# Patient Record
Sex: Female | Born: 1985 | Race: Black or African American | Hispanic: Yes | Marital: Single | Smoking: Former smoker
Health system: Southern US, Community
[De-identification: ages and names within clinical notes are randomized; demographics above are authoritative.]

## PROBLEM LIST (undated history)

## (undated) ENCOUNTER — Inpatient Hospital Stay (HOSPITAL_COMMUNITY): Payer: Self-pay

## (undated) DIAGNOSIS — O24419 Gestational diabetes mellitus in pregnancy, unspecified control: Secondary | ICD-10-CM

## (undated) DIAGNOSIS — F419 Anxiety disorder, unspecified: Secondary | ICD-10-CM

## (undated) DIAGNOSIS — F909 Attention-deficit hyperactivity disorder, unspecified type: Secondary | ICD-10-CM

## (undated) DIAGNOSIS — F329 Major depressive disorder, single episode, unspecified: Secondary | ICD-10-CM

## (undated) DIAGNOSIS — F32A Depression, unspecified: Secondary | ICD-10-CM

## (undated) DIAGNOSIS — F4310 Post-traumatic stress disorder, unspecified: Secondary | ICD-10-CM

## (undated) HISTORY — PX: TONSILLECTOMY: SUR1361

## (undated) HISTORY — PX: OTHER SURGICAL HISTORY: SHX169

## (undated) HISTORY — PX: TUBAL LIGATION: SHX77

---

## 2007-07-01 HISTORY — PX: OTHER SURGICAL HISTORY: SHX169

## 2010-08-19 LAB — CBC
Hemoglobin: 11.1 g/dL — AB (ref 12.0–16.0)
Platelets: 229 10*3/uL (ref 150–399)

## 2010-08-19 LAB — RPR
RPR: NONREACTIVE
RPR: NONREACTIVE

## 2010-08-19 LAB — ABO/RH: RH Type: POSITIVE

## 2010-08-19 LAB — RUBELLA ANTIBODY, IGM: Rubella: IMMUNE

## 2011-01-17 ENCOUNTER — Other Ambulatory Visit: Payer: Self-pay | Admitting: Obstetrics and Gynecology

## 2011-01-20 ENCOUNTER — Inpatient Hospital Stay (HOSPITAL_COMMUNITY)
Admission: AD | Admit: 2011-01-20 | Discharge: 2011-01-20 | Disposition: A | Discharge status: Home / Self Care | Payer: Self-pay | Source: Ambulatory Visit | Attending: Obstetrics and Gynecology | Admitting: Obstetrics and Gynecology

## 2011-01-20 ENCOUNTER — Encounter (HOSPITAL_COMMUNITY): Payer: Self-pay | Admitting: *Deleted

## 2011-01-20 DIAGNOSIS — F909 Attention-deficit hyperactivity disorder, unspecified type: Secondary | ICD-10-CM | POA: Insufficient documentation

## 2011-01-20 DIAGNOSIS — O4702 False labor before 37 completed weeks of gestation, second trimester: Secondary | ICD-10-CM

## 2011-01-20 DIAGNOSIS — O99810 Abnormal glucose complicating pregnancy: Secondary | ICD-10-CM | POA: Insufficient documentation

## 2011-01-20 DIAGNOSIS — O4700 False labor before 37 completed weeks of gestation, unspecified trimester: Secondary | ICD-10-CM | POA: Insufficient documentation

## 2011-01-20 HISTORY — DX: Anxiety disorder, unspecified: F41.9

## 2011-01-20 HISTORY — DX: Attention-deficit hyperactivity disorder, unspecified type: F90.9

## 2011-01-20 HISTORY — DX: Major depressive disorder, single episode, unspecified: F32.9

## 2011-01-20 HISTORY — DX: Post-traumatic stress disorder, unspecified: F43.10

## 2011-01-20 HISTORY — DX: Depression, unspecified: F32.A

## 2011-01-20 HISTORY — DX: Gestational diabetes mellitus in pregnancy, unspecified control: O24.419

## 2011-01-20 MED ORDER — NIFEDIPINE 10 MG PO CAPS
10.0000 mg | ORAL_CAPSULE | Freq: Once | ORAL | Status: AC
  Administered 2011-01-20: 10 mg via ORAL
  Filled 2011-01-20: qty 1

## 2011-01-20 NOTE — ED Provider Notes (Signed)
History     Chief Complaint  Patient presents with  . Contractions   HPI Pt sent from office today due to uterine contractions noted on toco while having NST at the office.  Pt reports feeling some pelvic pressure and some contractions which she describes as mild.  She denies ROM or bldg.  She reports her fetus is moving normally.  Her SVE at the office was noted to be closed/long and -1 per Dr. Su Hilt.    OB History    Grav Para Term Preterm Abortions TAB SAB Ect Mult Living   3 2 2       2       Past Medical History  Diagnosis Date  . Gestational diabetes   . Asthma   . Anxiety   . Depression   . ADHD (attention deficit hyperactivity disorder)   . ADHD (attention deficit hyperactivity disorder)   . PTSD (post-traumatic stress disorder)     Past Surgical History  Procedure Date  . Cesarean section   . Tonsillectomy   . Addnoidectomy     No family history on file.  History  Substance Use Topics  . Smoking status: Current Everyday Smoker -- 4.0 packs/day  . Smokeless tobacco: Not on file  . Alcohol Use: No    Allergies: No Known Allergies  No prescriptions prior to admission    Review of Systems  Constitutional: Negative.   HENT: Negative.   Eyes: Negative.   Respiratory: Negative.   Gastrointestinal: Negative.   Genitourinary: Negative.   Musculoskeletal: Negative.   Skin: Negative.   Neurological: Negative.   Endo/Heme/Allergies: Negative.   Psychiatric/Behavioral: Negative.    Physical Exam   Blood pressure 119/74, pulse 74, temperature 98.1 F (36.7 C), temperature source Oral, resp. rate 20, height 5\' 7"  (1.702 m), weight 104.327 kg (230 lb), last menstrual period 05/12/2010, SpO2 99.00%. Filed Vitals:   01/20/11 1753 01/20/11 1930 01/20/11 2044  BP: 130/74 111/52 119/74  Pulse: 91 78 74  Temp: 98.1 F (36.7 C)    TempSrc: Oral    Resp: 20 20   Height: 5\' 7"  (1.702 m)    Weight: 104.327 kg (230 lb)    SpO2: 99%     Physical Exam    Constitutional: She is oriented to person, place, and time. She appears well-developed and well-nourished.  HENT:  Head: Normocephalic and atraumatic.  Eyes: Pupils are equal, round, and reactive to light.  Neck: Normal range of motion. Neck supple. No thyromegaly present.  Cardiovascular: Normal rate, regular rhythm, normal heart sounds and intact distal pulses.   Respiratory: Effort normal and breath sounds normal.  GI: Soft. Bowel sounds are normal.  Genitourinary: Vagina normal and uterus normal.  Musculoskeletal: Normal range of motion.  Neurological: She is alert and oriented to person, place, and time. She has normal reflexes.  Skin: Skin is warm and dry.  Psychiatric: She has a normal mood and affect. Her behavior is normal. Judgment and thought content normal.   FHR tracing Cat 1 with accels present, no decels present and moderate variability present.  Contractions approx every on admission with decrease to uterine irritability after dose of Procardia 10mg  x 1. SVE per RN upon admission: FT/Thick SVE at discharge: FT/25%/-3   MAU Course  Procedures Fetal monitoring Tocolytic  Assessment and Plan  IUP at 36.1 weeks. Gestational diabetes. False labor.  Consult obtained with Dr. Estanislado Pandy. Pt discharged to home and will follow up as scheduled on Thursday, 01/23/11.  Kathleen Ewen O. 01/20/2011, 10:49 PM

## 2011-01-20 NOTE — Progress Notes (Signed)
PT WAS AT OFFICE TODAY AND WAS CLOSED STARTED HAVING CONTRACTIONS.

## 2011-01-22 ENCOUNTER — Other Ambulatory Visit: Payer: Self-pay | Admitting: Obstetrics and Gynecology

## 2011-01-22 ENCOUNTER — Encounter: Payer: Medicaid Other | Attending: Specialist | Admitting: Dietician

## 2011-01-22 DIAGNOSIS — Z713 Dietary counseling and surveillance: Secondary | ICD-10-CM | POA: Insufficient documentation

## 2011-01-22 DIAGNOSIS — O99810 Abnormal glucose complicating pregnancy: Secondary | ICD-10-CM | POA: Insufficient documentation

## 2011-01-22 DIAGNOSIS — O24419 Gestational diabetes mellitus in pregnancy, unspecified control: Secondary | ICD-10-CM

## 2011-01-23 NOTE — Progress Notes (Deleted)
Subjective:     Patient ID: Kathleen Collins, female   DOB: 05/08/1986, 25 y.o.   MRN: [id number]  HPI   Review of Systems     Objective:   Physical Exam     Assessment:     ***    Plan:     ***

## 2011-01-23 NOTE — Progress Notes (Signed)
  Patient was seen on 01/22/2011 for Gestational Diabetes self-management class at [facility]. The following learning objectives were met by the patient during this course:   States the definition of Gestational Diabetes  States why dietary management is important in controlling blood glucose  Describes the effects each nutrient has on blood glucose levels  Demonstrates ability to create a balanced meal plan  Demonstrates carbohydrate counting   States when to check blood glucose levels  Demonstrates proper blood glucose monitoring techniques  States the effect of stress and exercise on blood glucose levels  States the importance of limiting caffeine and abstaining from alcohol and smoking  Blood glucose monitor given: [employer] Monitoring System) Lot # [id number] Exp: 05/29/2012 Blood glucose reading: 131 @ 6:50 PM.  Had large snack prior to class.  Patient instructed to monitor glucose levels: Fasting and 2 hours post meal FBS: 60 - <90 1 hour: <140 2 hour: <120  Patient will be seen for follow-up as needed.

## 2011-01-24 ENCOUNTER — Other Ambulatory Visit: Payer: Self-pay | Admitting: Obstetrics and Gynecology

## 2011-02-03 ENCOUNTER — Encounter (HOSPITAL_COMMUNITY): Payer: Self-pay

## 2011-02-03 ENCOUNTER — Encounter (HOSPITAL_COMMUNITY)
Admission: RE | Admit: 2011-02-03 | Discharge: 2011-02-03 | Disposition: A | Discharge status: Home / Self Care | Payer: Medicaid Other | Source: Ambulatory Visit | Attending: Obstetrics and Gynecology | Admitting: Obstetrics and Gynecology

## 2011-02-03 LAB — CBC
MCH: 27.2 pg (ref 26.0–34.0)
MCHC: 34.3 g/dL (ref 30.0–36.0)
Platelets: 210 10*3/uL (ref 150–400)

## 2011-02-03 LAB — BASIC METABOLIC PANEL
BUN: 6 mg/dL (ref 6–23)
Calcium: 10.2 mg/dL (ref 8.4–10.5)
GFR calc non Af Amer: >60 mL/min (ref >60)
Glucose, Bld: 81 mg/dL (ref 70–99)

## 2011-02-03 LAB — RPR: RPR Ser Ql: NONREACTIVE

## 2011-02-03 NOTE — Patient Instructions (Signed)
20 Kathleen Collins  02/03/2011   Your procedure is scheduled on:  02/10/11  Report to [facility] at 0745 AM. main entrance desk  Call this number if you have problems the morning of surgery: [phone]   Remember: bring inhaler to hospital   Do not eat food:After Midnight.  Do not drink clear liquids: After Midnight.  Take these medicines the morning of surgery with A SIP OF WATER: none   Do not wear jewelry, make-up or nail polish.  Do not wear lotions, powders, or perfumes. You may wear deodorant.  Do not shave 48 hours prior to surgery.  Do not bring valuables to the hospital.  Contacts, dentures or bridgework may not be worn into surgery.  Leave suitcase in the car. After surgery it may be brought to your room.  For patients admitted to the hospital, checkout time is 11:00 AM the day of discharge.   Patients discharged the day of surgery will not be allowed to drive home.  Name and phone number of your driver: Kathleen Collins- mother- [phone]  Special Instructions: N/A   Please read over the following fact sheets that you were given:

## 2011-02-04 MED ORDER — CEFAZOLIN SODIUM 1 G IJ SOLR: 2.0000 g | Freq: Three times a day (TID) | INTRAMUSCULAR | Status: AC

## 2011-02-04 MED ORDER — DEXTROSE 5 % IV SOLN: 2.0000 g | Freq: Once | INTRAVENOUS | Status: DC

## 2011-02-10 ENCOUNTER — Encounter (HOSPITAL_COMMUNITY): Payer: Self-pay | Admitting: Registered Nurse

## 2011-02-10 ENCOUNTER — Encounter (HOSPITAL_COMMUNITY): Payer: Self-pay | Admitting: Anesthesiology

## 2011-02-10 ENCOUNTER — Encounter (HOSPITAL_COMMUNITY): Payer: Self-pay | Admitting: *Deleted

## 2011-02-10 ENCOUNTER — Encounter (HOSPITAL_COMMUNITY)
Admission: AD | Disposition: A | Discharge status: Home / Self Care | Payer: Self-pay | Source: Ambulatory Visit | Attending: Obstetrics and Gynecology

## 2011-02-10 ENCOUNTER — Inpatient Hospital Stay (HOSPITAL_COMMUNITY)
Admission: AD | Admit: 2011-02-10 | Discharge: 2011-02-13 | DRG: 766 | Disposition: A | Discharge status: Home / Self Care | Payer: Medicaid Other | Source: Ambulatory Visit | Attending: Obstetrics and Gynecology | Admitting: Obstetrics and Gynecology

## 2011-02-10 ENCOUNTER — Inpatient Hospital Stay (HOSPITAL_COMMUNITY): Payer: Medicaid Other | Admitting: Registered Nurse

## 2011-02-10 DIAGNOSIS — Z01818 Encounter for other preprocedural examination: Secondary | ICD-10-CM

## 2011-02-10 DIAGNOSIS — O99344 Other mental disorders complicating childbirth: Secondary | ICD-10-CM | POA: Diagnosis present

## 2011-02-10 DIAGNOSIS — O24419 Gestational diabetes mellitus in pregnancy, unspecified control: Secondary | ICD-10-CM | POA: Diagnosis present

## 2011-02-10 DIAGNOSIS — F319 Bipolar disorder, unspecified: Secondary | ICD-10-CM | POA: Diagnosis present

## 2011-02-10 DIAGNOSIS — J45909 Unspecified asthma, uncomplicated: Secondary | ICD-10-CM

## 2011-02-10 DIAGNOSIS — O34219 Maternal care for unspecified type scar from previous cesarean delivery: Principal | ICD-10-CM | POA: Diagnosis present

## 2011-02-10 DIAGNOSIS — O99814 Abnormal glucose complicating childbirth: Secondary | ICD-10-CM | POA: Diagnosis present

## 2011-02-10 DIAGNOSIS — Z01812 Encounter for preprocedural laboratory examination: Secondary | ICD-10-CM

## 2011-02-10 LAB — TYPE AND SCREEN
ABO/RH(D): B POS
Antibody Screen: NEGATIVE

## 2011-02-10 SURGERY — Surgical Case
Anesthesia: Spinal | Wound class: Clean Contaminated

## 2011-02-10 MED ORDER — SODIUM CHLORIDE 0.9 % IJ SOLN: 3.0000 mL | INTRAMUSCULAR | Status: DC | PRN

## 2011-02-10 MED ORDER — SCOPOLAMINE 1 MG/3DAYS TD PT72: 1.0000 | MEDICATED_PATCH | Freq: Once | TRANSDERMAL | Status: DC

## 2011-02-10 MED ORDER — DIPHENHYDRAMINE HCL 25 MG PO CAPS
25.0000 mg | ORAL_CAPSULE | ORAL | Status: DC | PRN
  Administered 2011-02-11: 25 mg via ORAL
  Filled 2011-02-10 (×2): qty 1

## 2011-02-10 MED ORDER — DIPHENHYDRAMINE HCL 25 MG PO CAPS
25.0000 mg | ORAL_CAPSULE | Freq: Four times a day (QID) | ORAL | Status: DC | PRN
  Administered 2011-02-10: 25 mg via ORAL

## 2011-02-10 MED ORDER — FENTANYL CITRATE 0.05 MG/ML IJ SOLN
INTRAMUSCULAR | Status: DC | PRN
  Administered 2011-02-10: 12.5 ug via INTRAVENOUS

## 2011-02-10 MED ORDER — EPHEDRINE SULFATE 50 MG/ML IJ SOLN
INTRAMUSCULAR | Status: DC | PRN
  Administered 2011-02-10 (×2): 10 mg via INTRAVENOUS

## 2011-02-10 MED ORDER — PROMETHAZINE HCL 25 MG/ML IJ SOLN: 6.2500 mg | INTRAMUSCULAR | Status: DC | PRN

## 2011-02-10 MED ORDER — METOCLOPRAMIDE HCL 5 MG/ML IJ SOLN: 10.0000 mg | Freq: Three times a day (TID) | INTRAMUSCULAR | Status: DC | PRN

## 2011-02-10 MED ORDER — PHENYLEPHRINE 40 MCG/ML (10ML) SYRINGE FOR IV PUSH (FOR BLOOD PRESSURE SUPPORT)
PREFILLED_SYRINGE | INTRAVENOUS | Status: AC
  Filled 2011-02-10: qty 5

## 2011-02-10 MED ORDER — NALOXONE HCL 0.4 MG/ML IJ SOLN: 0.4000 mg | INTRAMUSCULAR | Status: DC | PRN

## 2011-02-10 MED ORDER — PANTOPRAZOLE SODIUM 40 MG PO TBEC: 40.0000 mg | DELAYED_RELEASE_TABLET | Freq: Once | ORAL | Status: DC | PRN

## 2011-02-10 MED ORDER — ONDANSETRON HCL 4 MG/2ML IJ SOLN
INTRAMUSCULAR | Status: AC
  Filled 2011-02-10: qty 2

## 2011-02-10 MED ORDER — IBUPROFEN 600 MG PO TABS
600.0000 mg | ORAL_TABLET | Freq: Four times a day (QID) | ORAL | Status: DC | PRN
  Filled 2011-02-10 (×7): qty 1

## 2011-02-10 MED ORDER — ALBUTEROL SULFATE HFA 108 (90 BASE) MCG/ACT IN AERS
1.0000 | INHALATION_SPRAY | RESPIRATORY_TRACT | Status: DC | PRN
  Administered 2011-02-10: 1 via RESPIRATORY_TRACT
  Filled 2011-02-10: qty 6.7

## 2011-02-10 MED ORDER — BUPIVACAINE HCL (PF) 0.25 % IJ SOLN
INTRAMUSCULAR | Status: DC | PRN
  Administered 2011-02-10: 20 mL

## 2011-02-10 MED ORDER — FLUOXETINE HCL 20 MG PO CAPS
20.0000 mg | ORAL_CAPSULE | Freq: Every day | ORAL | Status: DC
  Administered 2011-02-10 – 2011-02-13 (×4): 20 mg via ORAL
  Filled 2011-02-10 (×4): qty 1

## 2011-02-10 MED ORDER — METHYLERGONOVINE MALEATE 0.2 MG PO TABS: 0.2000 mg | ORAL_TABLET | ORAL | Status: DC | PRN

## 2011-02-10 MED ORDER — LANOLIN HYDROUS EX OINT: 1.0000 "application " | TOPICAL_OINTMENT | CUTANEOUS | Status: DC | PRN

## 2011-02-10 MED ORDER — TETANUS-DIPHTH-ACELL PERTUSSIS 5-2.5-18.5 LF-MCG/0.5 IM SUSP: 0.5000 mL | Freq: Once | INTRAMUSCULAR | Status: DC

## 2011-02-10 MED ORDER — KETOROLAC TROMETHAMINE 30 MG/ML IJ SOLN: 30.0000 mg | Freq: Four times a day (QID) | INTRAMUSCULAR | Status: AC | PRN

## 2011-02-10 MED ORDER — FAMOTIDINE 20 MG PO TABS: 20.0000 mg | ORAL_TABLET | Freq: Once | ORAL | Status: DC | PRN

## 2011-02-10 MED ORDER — ARIPIPRAZOLE 2 MG PO TABS
2.0000 mg | ORAL_TABLET | Freq: Every day | ORAL | Status: DC
  Administered 2011-02-10 – 2011-02-13 (×4): 2 mg via ORAL
  Filled 2011-02-10 (×4): qty 1

## 2011-02-10 MED ORDER — EPHEDRINE 5 MG/ML INJ
INTRAVENOUS | Status: AC
  Filled 2011-02-10: qty 10

## 2011-02-10 MED ORDER — WITCH HAZEL-GLYCERIN EX PADS: 1.0000 "application " | MEDICATED_PAD | CUTANEOUS | Status: DC | PRN

## 2011-02-10 MED ORDER — MENTHOL 3 MG MT LOZG: 1.0000 | LOZENGE | OROMUCOSAL | Status: DC | PRN

## 2011-02-10 MED ORDER — CEFAZOLIN SODIUM-DEXTROSE 2-3 GM-% IV SOLR
2.0000 g | Freq: Once | INTRAVENOUS | Status: DC
  Filled 2011-02-10: qty 50

## 2011-02-10 MED ORDER — KETOROLAC TROMETHAMINE 30 MG/ML IJ SOLN: 30.0000 mg | Freq: Four times a day (QID) | INTRAMUSCULAR | Status: DC | PRN

## 2011-02-10 MED ORDER — CEFAZOLIN SODIUM 1-5 GM-% IV SOLN
INTRAVENOUS | Status: DC | PRN
  Administered 2011-02-10: 2 g via INTRAVENOUS

## 2011-02-10 MED ORDER — MORPHINE SULFATE 0.5 MG/ML IJ SOLN
INTRAMUSCULAR | Status: AC
  Filled 2011-02-10: qty 10

## 2011-02-10 MED ORDER — NALBUPHINE SYRINGE 5 MG/0.5 ML
5.0000 mg | INJECTION | INTRAMUSCULAR | Status: DC | PRN
  Filled 2011-02-10: qty 1

## 2011-02-10 MED ORDER — ZOLPIDEM TARTRATE 5 MG PO TABS: 5.0000 mg | ORAL_TABLET | Freq: Every evening | ORAL | Status: DC | PRN

## 2011-02-10 MED ORDER — PHENYLEPHRINE HCL 10 MG/ML IJ SOLN
INTRAMUSCULAR | Status: DC | PRN
  Administered 2011-02-10: 120 ug via INTRAVENOUS
  Administered 2011-02-10: 40 ug via INTRAVENOUS
  Administered 2011-02-10: 80 ug via INTRAVENOUS
  Administered 2011-02-10: 40 ug via INTRAVENOUS

## 2011-02-10 MED ORDER — SODIUM CHLORIDE 0.9 % IV SOLN: 1.0000 ug/kg/h | INTRAVENOUS | Status: DC | PRN

## 2011-02-10 MED ORDER — NALBUPHINE HCL 10 MG/ML IJ SOLN
5.0000 mg | INTRAMUSCULAR | Status: DC | PRN
  Administered 2011-02-10 (×2): 5 mg via INTRAVENOUS

## 2011-02-10 MED ORDER — SENNOSIDES-DOCUSATE SODIUM 8.6-50 MG PO TABS
2.0000 | ORAL_TABLET | Freq: Every day | ORAL | Status: DC
  Administered 2011-02-10 – 2011-02-12 (×3): 2 via ORAL

## 2011-02-10 MED ORDER — ONDANSETRON HCL 4 MG PO TABS: 4.0000 mg | ORAL_TABLET | ORAL | Status: DC | PRN

## 2011-02-10 MED ORDER — IBUPROFEN 600 MG PO TABS
600.0000 mg | ORAL_TABLET | Freq: Four times a day (QID) | ORAL | Status: DC
  Administered 2011-02-10 – 2011-02-13 (×11): 600 mg via ORAL
  Filled 2011-02-10 (×4): qty 1

## 2011-02-10 MED ORDER — KETOROLAC TROMETHAMINE 60 MG/2ML IM SOLN
60.0000 mg | Freq: Once | INTRAMUSCULAR | Status: AC | PRN
  Administered 2011-02-10: 60 mg via INTRAMUSCULAR

## 2011-02-10 MED ORDER — DIPHENHYDRAMINE HCL 50 MG/ML IJ SOLN: 12.5000 mg | INTRAMUSCULAR | Status: DC | PRN

## 2011-02-10 MED ORDER — SIMETHICONE 80 MG PO CHEW
80.0000 mg | CHEWABLE_TABLET | ORAL | Status: DC | PRN
  Administered 2011-02-11: 80 mg via ORAL

## 2011-02-10 MED ORDER — DIPHENHYDRAMINE HCL 50 MG/ML IJ SOLN: 25.0000 mg | INTRAMUSCULAR | Status: DC | PRN

## 2011-02-10 MED ORDER — OXYTOCIN 10 UNIT/ML IJ SOLN
INTRAMUSCULAR | Status: AC
  Filled 2011-02-10: qty 4

## 2011-02-10 MED ORDER — DIPHENHYDRAMINE HCL 25 MG PO CAPS: 25.0000 mg | ORAL_CAPSULE | ORAL | Status: DC | PRN

## 2011-02-10 MED ORDER — OXYCODONE-ACETAMINOPHEN 5-325 MG PO TABS
1.0000 | ORAL_TABLET | ORAL | Status: DC | PRN
  Administered 2011-02-10: 1 via ORAL
  Administered 2011-02-11 (×2): 2 via ORAL
  Administered 2011-02-11 – 2011-02-12 (×4): 1 via ORAL
  Administered 2011-02-12: 2 via ORAL
  Administered 2011-02-13: 1 via ORAL
  Administered 2011-02-13: 2 via ORAL
  Filled 2011-02-10 (×2): qty 1
  Filled 2011-02-10 (×2): qty 2
  Filled 2011-02-10: qty 1
  Filled 2011-02-10: qty 2
  Filled 2011-02-10 (×3): qty 1
  Filled 2011-02-10: qty 2

## 2011-02-10 MED ORDER — LACTATED RINGERS IV SOLN
INTRAVENOUS | Status: DC
  Administered 2011-02-10 (×4): via INTRAVENOUS

## 2011-02-10 MED ORDER — KETOROLAC TROMETHAMINE 60 MG/2ML IM SOLN
INTRAMUSCULAR | Status: AC
  Filled 2011-02-10: qty 2

## 2011-02-10 MED ORDER — BUPIVACAINE HCL 0.75 % IJ SOLN
INTRAMUSCULAR | Status: DC | PRN
  Administered 2011-02-10: 1.8 mL

## 2011-02-10 MED ORDER — MEPERIDINE HCL 25 MG/ML IJ SOLN: 6.2500 mg | INTRAMUSCULAR | Status: DC | PRN

## 2011-02-10 MED ORDER — SCOPOLAMINE 1 MG/3DAYS TD PT72
MEDICATED_PATCH | TRANSDERMAL | Status: AC
  Administered 2011-02-10: 1.5 mg via TRANSDERMAL
  Filled 2011-02-10: qty 1

## 2011-02-10 MED ORDER — LACTATED RINGERS IV SOLN
INTRAVENOUS | Status: DC
  Administered 2011-02-10: 19:00:00 via INTRAVENOUS

## 2011-02-10 MED ORDER — OXYTOCIN 20 UNITS IN LACTATED RINGERS INFUSION - SIMPLE
INTRAVENOUS | Status: DC | PRN
  Administered 2011-02-10 (×2): 20 [IU] via INTRAVENOUS

## 2011-02-10 MED ORDER — ONDANSETRON HCL 4 MG/2ML IJ SOLN: 4.0000 mg | Freq: Three times a day (TID) | INTRAMUSCULAR | Status: DC | PRN

## 2011-02-10 MED ORDER — METHYLERGONOVINE MALEATE 0.2 MG/ML IJ SOLN: 0.2000 mg | INTRAMUSCULAR | Status: DC | PRN

## 2011-02-10 MED ORDER — MORPHINE SULFATE (PF) 0.5 MG/ML IJ SOLN
INTRAMUSCULAR | Status: DC | PRN
  Administered 2011-02-10: .1 mg via EPIDURAL

## 2011-02-10 MED ORDER — FENTANYL CITRATE 0.05 MG/ML IJ SOLN: 25.0000 ug | INTRAMUSCULAR | Status: DC | PRN

## 2011-02-10 MED ORDER — IPRATROPIUM-ALBUTEROL 18-103 MCG/ACT IN AERO
2.0000 | INHALATION_SPRAY | RESPIRATORY_TRACT | Status: DC
  Administered 2011-02-11 – 2011-02-12 (×2): 2 via RESPIRATORY_TRACT
  Filled 2011-02-10: qty 14.7

## 2011-02-10 MED ORDER — ACETAMINOPHEN 10 MG/ML IV SOLN: 1000.0000 mg | Freq: Four times a day (QID) | INTRAVENOUS | Status: DC | PRN

## 2011-02-10 MED ORDER — OXYTOCIN 20 UNITS IN LACTATED RINGERS INFUSION - SIMPLE: 125.0000 mL/h | INTRAVENOUS | Status: AC

## 2011-02-10 MED ORDER — DIBUCAINE 1 % RE OINT: 1.0000 "application " | TOPICAL_OINTMENT | RECTAL | Status: DC | PRN

## 2011-02-10 MED ORDER — SCOPOLAMINE 1 MG/3DAYS TD PT72
1.0000 | MEDICATED_PATCH | Freq: Once | TRANSDERMAL | Status: DC | PRN
  Administered 2011-02-10: 1.5 mg via TRANSDERMAL

## 2011-02-10 MED ORDER — ONDANSETRON HCL 4 MG/2ML IJ SOLN
INTRAMUSCULAR | Status: DC | PRN
  Administered 2011-02-10: 4 mg via INTRAVENOUS

## 2011-02-10 MED ORDER — METOCLOPRAMIDE HCL 10 MG PO TABS: 10.0000 mg | ORAL_TABLET | Freq: Once | ORAL | Status: DC | PRN

## 2011-02-10 MED ORDER — SIMETHICONE 80 MG PO CHEW
80.0000 mg | CHEWABLE_TABLET | Freq: Three times a day (TID) | ORAL | Status: DC
  Administered 2011-02-10 – 2011-02-13 (×10): 80 mg via ORAL

## 2011-02-10 MED ORDER — CITRIC ACID-SODIUM CITRATE 334-500 MG/5ML PO SOLN: 30.0000 mL | Freq: Once | ORAL | Status: DC | PRN

## 2011-02-10 MED ORDER — MEASLES, MUMPS & RUBELLA VAC ~~LOC~~ INJ: 0.5000 mL | INJECTION | Freq: Once | SUBCUTANEOUS | Status: DC

## 2011-02-10 MED ORDER — NALBUPHINE HCL 10 MG/ML IJ SOLN: 5.0000 mg | INTRAMUSCULAR | Status: DC | PRN

## 2011-02-10 MED ORDER — PRENATAL PLUS 27-1 MG PO TABS
1.0000 | ORAL_TABLET | Freq: Every day | ORAL | Status: DC
  Administered 2011-02-12: 1 via ORAL
  Filled 2011-02-10 (×3): qty 1

## 2011-02-10 MED ORDER — ACETAMINOPHEN 325 MG PO TABS: 325.0000 mg | ORAL_TABLET | ORAL | Status: DC | PRN

## 2011-02-10 MED ORDER — BUPROPION HCL ER (XL) 300 MG PO TB24
300.0000 mg | ORAL_TABLET | Freq: Every day | ORAL | Status: DC
  Filled 2011-02-10 (×2): qty 1

## 2011-02-10 MED ORDER — ONDANSETRON HCL 4 MG/2ML IJ SOLN: 4.0000 mg | INTRAMUSCULAR | Status: DC | PRN

## 2011-02-10 MED ORDER — CEFAZOLIN SODIUM 1-5 GM-% IV SOLN
INTRAVENOUS | Status: AC
  Filled 2011-02-10: qty 100

## 2011-02-10 MED ORDER — KETOROLAC TROMETHAMINE 30 MG/ML IJ SOLN
15.0000 mg | Freq: Once | INTRAMUSCULAR | Status: AC | PRN
Start: 2011-02-10 — End: 2011-02-10

## 2011-02-10 MED ORDER — FENTANYL CITRATE 0.05 MG/ML IJ SOLN
INTRAMUSCULAR | Status: AC
  Filled 2011-02-10: qty 2

## 2011-02-10 MED ORDER — FERROUS SULFATE 325 (65 FE) MG PO TABS
325.0000 mg | ORAL_TABLET | Freq: Two times a day (BID) | ORAL | Status: DC
  Administered 2011-02-10 – 2011-02-13 (×6): 325 mg via ORAL
  Filled 2011-02-10 (×6): qty 1

## 2011-02-10 SURGICAL SUPPLY — 35 items
BENZOIN TINCTURE PRP APPL 2/3 (GAUZE/BANDAGES/DRESSINGS) ×2 IMPLANT
BOOTIES KNEE HIGH SLOAN (MISCELLANEOUS) ×4 IMPLANT
CHLORAPREP W/TINT 26ML (MISCELLANEOUS) ×2 IMPLANT
CLOTH BEACON ORANGE TIMEOUT ST (SAFETY) ×2 IMPLANT
DRAIN JACKSON PRT FLT 10 (DRAIN) IMPLANT
DRESSING TELFA 8X3 (GAUZE/BANDAGES/DRESSINGS) ×4 IMPLANT
ELECT REM PT RETURN 9FT ADLT (ELECTROSURGICAL) ×2
ELECTRODE REM PT RTRN 9FT ADLT (ELECTROSURGICAL) ×1 IMPLANT
EVACUATOR SILICONE 100CC (DRAIN) IMPLANT
EXTRACTOR VACUUM M CUP 4 TUBE (SUCTIONS) IMPLANT
GAUZE SPONGE 4X4 12PLY STRL LF (GAUZE/BANDAGES/DRESSINGS) ×4 IMPLANT
GLOVE BIOGEL PI IND STRL 7.0 (GLOVE) ×2 IMPLANT
GLOVE BIOGEL PI INDICATOR 7.0 (GLOVE) ×2
GLOVE ECLIPSE 6.5 STRL STRAW (GLOVE) ×2 IMPLANT
GOWN PREVENTION PLUS LG XLONG (DISPOSABLE) ×6 IMPLANT
KIT ABG SYR 3ML LUER SLIP (SYRINGE) IMPLANT
NEEDLE HYPO 22GX1.5 SAFETY (NEEDLE) ×2 IMPLANT
NEEDLE HYPO 25X5/8 SAFETYGLIDE (NEEDLE) IMPLANT
NS IRRIG 1000ML POUR BTL (IV SOLUTION) ×4 IMPLANT
PACK C SECTION WH (CUSTOM PROCEDURE TRAY) ×2 IMPLANT
PAD ABD 7.5X8 STRL (GAUZE/BANDAGES/DRESSINGS) ×2 IMPLANT
RTRCTR C-SECT PINK 25CM LRG (MISCELLANEOUS) ×2 IMPLANT
SLEEVE SCD COMPRESS KNEE MED (MISCELLANEOUS) IMPLANT
STRIP CLOSURE SKIN 1/2X4 (GAUZE/BANDAGES/DRESSINGS) ×2 IMPLANT
SUT CHROMIC GUT AB #0 18 (SUTURE) IMPLANT
SUT MNCRL AB 3-0 PS2 27 (SUTURE) ×2 IMPLANT
SUT SILK 2 0 FSL 18 (SUTURE) IMPLANT
SUT VIC AB 0 CTX 36 (SUTURE) ×3
SUT VIC AB 0 CTX36XBRD ANBCTRL (SUTURE) ×3 IMPLANT
SUT VIC AB 1 CT1 36 (SUTURE) ×4 IMPLANT
SYR 20CC LL (SYRINGE) ×2 IMPLANT
TAPE CLOTH SURG 4X10 WHT LF (GAUZE/BANDAGES/DRESSINGS) ×2 IMPLANT
TOWEL OR 17X24 6PK STRL BLUE (TOWEL DISPOSABLE) ×4 IMPLANT
TRAY FOLEY CATH 14FR (SET/KITS/TRAYS/PACK) ×2 IMPLANT
WATER STERILE IRR 1000ML POUR (IV SOLUTION) ×2 IMPLANT

## 2011-02-10 NOTE — Progress Notes (Signed)

## 2011-02-10 NOTE — Brief Op Note (Signed)
  02/10/2011  10:59 AM  PATIENT:  Kathleen Collins  25 y.o. female  PRE-OPERATIVE DIAGNOSIS:  Previous Cesarean; Gestational diabetes  POST-OPERATIVE DIAGNOSIS:  Previous Cesarean; Gestaional Diabetes  PROCEDURE:  Procedure(s): CESAREAN SECTION  SURGEON:  Surgeon(s): Esmeralda Arthur, MD MD  ASSISTANTS: Hillary Steelman CNM   ANESTHESIA:   spinal  LOCAL MEDICATIONS USED:  MARCAINE 20 CC  SPECIMEN:  placenta  DISPOSITION OF SPECIMEN:  L & D  COUNTS:  YES  ESTIMATED BLOOD LOSS: * No blood loss amount entered *   PATIENT DISPOSITION:  PACU - hemodynamically stable.   DICTATION #: [id number]    [id number] A MD 02/10/2011 10:59 AM

## 2011-02-10 NOTE — Anesthesia Postprocedure Evaluation (Signed)
Anesthesia Post Note  Patient: Kathleen Collins  Procedure(s) Performed:  CESAREAN SECTION - Repeat  Anesthesia type: Spinal  Patient location: PACU  Post pain: Pain level controlled  Post assessment: Post-op Vital signs reviewed  Last Vitals:  Filed Vitals:   02/10/11 1115  BP: 130/65  Pulse: 68  Temp:   Resp: 18    Post vital signs: Reviewed  Level of consciousness: awake  Complications: No apparent anesthesia complications

## 2011-02-10 NOTE — Transfer of Care (Signed)
Immediate Anesthesia Transfer of Care Note  Patient: Kathleen Collins  Procedure(s) Performed:  CESAREAN SECTION - Repeat  Patient Location: PACU  Anesthesia Type: Regional and Spinal  Level of Consciousness: awake, alert  and oriented  Airway & Oxygen Therapy: Patient Spontanous Breathing  Post-op Assessment: Report given to PACU RN and Post -op Vital signs reviewed and stable  Post vital signs: Reviewed and stable  Complications: No apparent anesthesia complications

## 2011-02-10 NOTE — Interval H&P Note (Signed)
History and Physical Interval Note:   02/10/2011   9:34 AM   Kathleen Collins  has presented today for surgery, with the diagnosis of Previous Cesarean;  The various methods of treatment have been discussed with the patient and family. After consideration of risks, benefits and other options for treatment, the patient has consented to  Procedure(s): CESAREAN SECTION as a surgical intervention .  I have reviewed the patients' chart and labs.  Questions were answered to the patient's satisfaction.     Silverio Lay A  MD

## 2011-02-10 NOTE — Op Note (Signed)
Kathleen Collins, Kathleen Collins             ACCOUNT NO.:  [id number]  MEDICAL RECORD NO.:  [id number]  LOCATION:  9146                          FACILITY:  [facility]  PHYSICIAN:  Crist Fat. Temica Righetti, M.D. DATE OF BIRTH:  [birth year]  DATE OF PROCEDURE:  02/10/2011 DATE OF DISCHARGE:                              OPERATIVE REPORT   PREOPERATIVE DIAGNOSES:  Intrauterine pregnancy at 39 weeks and 1 day class A2 gestational diabetes, previous cesarean section x2.  POSTOPERATIVE DIAGNOSES:  Intrauterine pregnancy at 39 weeks and 1 day class A2 gestational diabetes, previous cesarean section x2.  ANESTHESIA:  Spinal, Dr. Arby Barrette  PROCEDURE:  Repeat low transverse cesarean section.  SURGEON:  Crist Fat. Yama Nielson, MD  ASSISTANT:  Denny Levy.  ESTIMATED BLOOD LOSS:  600 mL.  PROCEDURE:  After being informed of the planned procedure with possible complications including bleeding, infection, injury to bowel, bladder or ureters, informed consent was obtained.  The patient was taken to cesarean suite and given spinal anesthesia without any complication. She was placed in a dorsal decubitus position, pelvis tilted to the left, prepped and draped in a sterile fashion and a Foley catheter was inserted in her bladder.  She was also wearing knee-high sequential compressive devices.  After assessing adequate level of anesthesia, we infiltrated the previous incision with 20 mL of Marcaine 0.25 and performed a Pfannenstiel incision which was brought down sharply to the fascia.  The fascia was incised in a low transverse fashion.  The linea alba was dissected.  Peritoneum was entered bluntly.  An Alexis retractor was easily positioned despite mild adhesions between the omentum and the upper aspect of the incision.  These adhesions do not need to be addressed at this time.  The visceral peritoneum was then entered in a low transverse fashion allowing [place] to safely develop a bladder flap. Myometrium was then  entered in a low transverse fashion first with knife then extended bluntly.  Amniotic fluid was clear.  We assisted the birth of a female infant at 10:14 a.m.  Mouth and nose were suctioned.  One nuchal cord was reduced baby was delivered.  Cord was clamped with two Kelly clamps and sectioned, and the baby was given to Dr. Joana Reamer, neonatologist, present at the birth.  10 mL of blood was drawn from the umbilical vein, and the placenta was allowed to deliver spontaneously.  It was complete.  Cord has three vessels and will be sent to Labor and Delivery.  Uterine revision was negative.  We proceeded with uterine closure in two layers, first with a running lock suture of 0 Vicryl then with a Lembert suture of 0 Vicryl imbricating the first one.  Hemostasis was completed on peritoneal edges using cauterization.  Both paracolic gutters were cleaned.  Both tubes and ovaries were assessed and normal.  The pelvis was profusely irrigated with warm saline to confirm a satisfactory hemostasis. Sponges and retractors were then removed.  Under fascia hemostasis was completed with cauterization as well as a figure-of-eight stitch of 0 Vicryl in the right angle on the muscle plane to control hemostasis.  Fascia was closed with 2 running sutures of 1 Vicryl meeting midline.  Wound was irrigated with warm  saline. Hemostasis was completed with cauterization, and the skin was closed with a subcuticular suture of 3-0 Monocryl and Steri-Strips.  Instrument and sponge count were complete x2.  Estimated blood loss was 600 mL.  The procedure was well tolerated by the patient who was taken to recovery room in a well and stable condition.  Little girl named Kathleen Collins was born at 10:14 a.m., received an Apgar of 9 at 1 minute and 9 at 5 minutes and weighed 6 pounds 15 ounces.  SPECIMEN:  Placenta to Labor and Delivery.     Crist Fat Jetson Pickrel, M.D.     SAR/MEDQ  D:  02/10/2011  T:  02/10/2011   Job:  [id number]
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
## 2011-02-10 NOTE — Consult Note (Signed)
Neonatology Note:   Attendance at C-section:    I was asked to attend this repeat C/S at term. The mother is a G3P2 B pos, GBS neg with ADHD, bipolar disorder, and anxiety. ROM at delivery, fluid clear. Infant vigorous with good spontaneous cry and tone. Needed only minimal bulb suctioning. Ap 9/9. Lungs clear to ausc in DR. To CN to care of Pediatrician.   Deatra James, MD

## 2011-02-10 NOTE — Anesthesia Preprocedure Evaluation (Signed)
Anesthesia Evaluation  Name, MR# and DOB Patient awake  General Assessment Comment  Reviewed: Allergy & Precautions, H&P , Patient's Chart, lab work & pertinent test results  Airway Mallampati: II TM Distance: >3 FB Neck ROM: full    Dental No notable dental hx.    Pulmonary  asthma  clear to auscultation  pulmonary exam normalPulmonary Exam Normal breath sounds clear to auscultation none    Cardiovascular regular Normal    Neuro/Psych    (+) PSYCHIATRIC DISORDERS,  Negative Neurological ROS  Negative Psych ROS  GI/Hepatic/Renal negative GI ROS, negative Liver ROS, and negative Renal ROS (+)       Endo/Other  Negative Endocrine ROS (+) Diabetes mellitus-,  Morbid obesity  Abdominal   Musculoskeletal   Hematology negative hematology ROS (+)   Peds  Reproductive/Obstetrics (+) Pregnancy    Anesthesia Other Findings             Anesthesia Physical Anesthesia Plan  ASA: III  Anesthesia Plan: Spinal   Post-op Pain Management:    Induction:   Airway Management Planned:   Additional Equipment:   Intra-op Plan:   Post-operative Plan:   Informed Consent: I have reviewed the patients History and Physical, chart, labs and discussed the procedure including the risks, benefits and alternatives for the proposed anesthesia with the patient or authorized representative who has indicated his/her understanding and acceptance.     Plan Discussed with:   Anesthesia Plan Comments:         Anesthesia Quick Evaluation

## 2011-02-10 NOTE — Anesthesia Procedure Notes (Addendum)
Spinal Block  Patient location during procedure: OR Start time: 02/10/2011 9:35 AM End time: 02/10/2011 9:42 AM Staffing Anesthesiologist: Sandrea Hughs Preanesthetic Checklist Completed: patient identified, site marked, surgical consent, pre-op evaluation, timeout performed, IV checked, risks and benefits discussed and monitors and equipment checked Spinal Block Patient position: sitting Prep: DuraPrep Patient monitoring: heart rate, cardiac monitor, continuous pulse ox and blood pressure Approach: midline Location: L3-4 Injection technique: single-shot Needle Needle type: Sprotte  Needle gauge: 24 G Needle length: 9 cm Needle insertion depth: 9 cm Assessment Sensory level: T4

## 2011-02-11 LAB — GLUCOSE, CAPILLARY: Glucose-Capillary: 75 mg/dL (ref 70–99)

## 2011-02-11 LAB — CBC
HCT: 27 % — ABNORMAL LOW (ref 36.0–46.0)
Hemoglobin: 9.1 g/dL — ABNORMAL LOW (ref 12.0–15.0)
MCHC: 33.7 g/dL (ref 30.0–36.0)

## 2011-02-11 NOTE — Progress Notes (Signed)
UR Chart review completed.  

## 2011-02-11 NOTE — Progress Notes (Signed)
Subjective: Postpartum Day 1: Cesarean Delivery Patient reports no problems voiding.  Up ad lib, breastfeeding going well.  Tolerating regular diet.     Objective: Vital signs in last 24 hours: Temp:  [97.3 F (36.3 C)-98 F (36.7 C)] 97.7 F (36.5 C) (08/14 0445) Pulse Rate:  [56-80] 56  (08/14 0445) Resp:  [18-20] 18  (08/14 0445) BP: (99-130)/(46-76) 99/62 mmHg (08/14 0445) SpO2:  [98 %-100 %] 100 % (08/14 0445) Weight:  [104.327 kg (230 lb)] 230 lb (104.327 kg) (08/13 1515) Orthostatic stable.  Physical Exam:  General: alert Lochia: appropriate Uterine Fundus: firm Incision: Dressing CDI. DVT Evaluation: No evidence of DVT seen on physical exam.   Basename 02/11/11 0545  HGB 9.1*  HCT 27.0*  FBS 87  Assessment/Plan: Status post Cesarean section, gestational diabetes.  Doing well postoperatively.  Continue current care.   Pistol Kessenich L 02/11/2011, 9:08 AM

## 2011-02-12 NOTE — Progress Notes (Signed)
CPS accepted the case and Kathleen Collins came to assess the pt this morning.  He completed a safety assessment and plans to speak with other collateral contacts.  SW will follow up with CPS worker tomorrow and inform staff of d/c plan.

## 2011-02-12 NOTE — Progress Notes (Signed)
Subjective: Postpartum Day 2: Cesarean Delivery Patient reports no problems voiding.  Up ad lib.  Breast and bottlefeeding.  Concerned about CPS involvement due to history of DSS case in the remote past.  "Want to do everything right, cooperating with all questions".  Baby in room with mom.  Unsure regarding contraception plans at present.  Objective: Vital signs in last 24 hours: Temp:  [98.2 F (36.8 C)-98.9 F (37.2 C)] 98.6 F (37 C) (08/15 0635) Pulse Rate:  [75-79] 76  (08/15 0635) Resp:  [18-20] 20  (08/15 0635) BP: (96-116)/(60-74) 114/74 mmHg (08/15 1610)  Physical Exam:  General: alert Lochia: appropriate Uterine Fundus: firm Incision: healing well DVT Evaluation: No evidence of DVT seen on physical exam.   Basename 02/11/11 0545  HGB 9.1*  HCT 27.0*    Assessment/Plan: Status post Cesarean section. Doing well postoperatively.  Continue current care. Anticipate d/c 02/13/11. Support to patient for DSS process.  Abiageal Blowe L 02/12/2011, 11:03 AM

## 2011-02-13 LAB — GLUCOSE, CAPILLARY: Glucose-Capillary: 82 mg/dL (ref 70–99)

## 2011-02-13 MED ORDER — OXYCODONE-ACETAMINOPHEN 5-325 MG PO TABS: 1.0000 | ORAL_TABLET | ORAL | Status: AC | PRN

## 2011-02-13 MED ORDER — LEVONORGESTREL-ETHINYL ESTRAD 0.15-30 MG-MCG PO TABS: 1.0000 | ORAL_TABLET | Freq: Every day | ORAL | Status: DC

## 2011-02-13 MED ORDER — PRENATAL PLUS 27-1 MG PO TABS: 1.0000 | ORAL_TABLET | Freq: Every day | ORAL | Status: DC

## 2011-02-13 MED ORDER — FERROUS SULFATE 325 (65 FE) MG PO TABS: 325.0000 mg | ORAL_TABLET | Freq: Two times a day (BID) | ORAL | Status: DC

## 2011-02-13 MED ORDER — IBUPROFEN 600 MG PO TABS: 600.0000 mg | ORAL_TABLET | Freq: Four times a day (QID) | ORAL | Status: AC | PRN

## 2011-02-13 NOTE — Progress Notes (Signed)
Subjective: Postpartum Day 3: Cesarean Delivery Patient reports tolerating PO, + flatus and no problems voiding.    Objective: Vital signs in last 24 hours: Temp:  [98.3 F (36.8 C)-98.5 F (36.9 C)] 98.4 F (36.9 C) (08/16 0453) Pulse Rate:  [75-77] 76  (08/16 0453) Resp:  [18-20] 18  (08/16 0453) BP: (123-126)/(77-83) 126/83 mmHg (08/16 0453)  Physical Exam:  General: alert, cooperative, appears stated age, no distress, moderately obese and no complaints.  Will plan to bottlefeed. Lochia: appropriate Uterine Fundus: firm Incision: healing well, no significant drainage, no dehiscence, no significant erythema DVT Evaluation: No evidence of DVT seen on physical exam.   Basename 02/11/11 0545  HGB 9.1*  HCT 27.0*    Assessment/Plan: Status post Cesarean section. Doing well postoperatively.  Discharge home with standard precautions and return to clinic in 4-6 weeks. Plans BCPs for contraception. Social work to complete consult prior to patient's discharge home.  HAYGOOD,VANESSA P 02/13/2011, 9:11 AM

## 2011-02-13 NOTE — Discharge Summary (Addendum)
Obstetric Discharge Summary Reason for Admission: cesarean section Prenatal Procedures: none Intrapartum Procedures: cesarean: low cervical, transverse Postpartum Procedures: T-dap given Pt actually decline T-dap saying she had it in 2009                                             POC cbgs done  Complications-Operative and Postpartum: none See progress note for today Hosp course:  The patient was admitted to the hospital after repeat cesarean section.  She had acceptable blood sugars without diabetic medications.  She had normal progression of diet, urinary and bowel function, and by POD#3 was ready for discharge home pending completion of social work consultation. Hemoglobin  Date Value Range Status  02/11/2011 9.1* 12.0-15.0 (g/dL) Final     HCT  Date Value Range Status  02/11/2011 27.0* 36.0-46.0 (%) Final    Discharge Diagnoses: Term Pregnancy-delivered and Prior CS, s/p repeat                                          ADHD                                          GDM no rx required postpartum                                           Asthma  Discharge Information: Date: 02/13/2011 Activity: pelvic rest and per practice instruction booklet Diet: CHO modified Medications: PNV, Ibuprophen, Percocet and birth control pills (see medication list Condition: improved Instructions: refer to practice specific booklet Discharge to: home Follow-up Information    Follow up with [facility], MD. Make an appointment in 6 weeks.   Contact information:   [address] [place] [address] [phone]          Newborn Data: Live born female  Birth Weight: 6 lb 15.3 oz (3155 g) APGAR: 9, 9  Home with mother and pending completion of social work consult.  Raghav Verrilli P 02/13/2011, 9:30 AM

## 2011-03-05 ENCOUNTER — Encounter (HOSPITAL_COMMUNITY): Payer: Self-pay | Admitting: Obstetrics and Gynecology

## 2011-04-15 ENCOUNTER — Encounter (HOSPITAL_COMMUNITY): Payer: Self-pay | Admitting: *Deleted

## 2011-06-09 ENCOUNTER — Emergency Department (HOSPITAL_COMMUNITY)
Admission: EM | Admit: 2011-06-09 | Discharge: 2011-06-10 | Disposition: A | Discharge status: Home / Self Care | Payer: Medicaid Other | Attending: Emergency Medicine | Admitting: Emergency Medicine

## 2011-06-09 ENCOUNTER — Encounter (HOSPITAL_COMMUNITY): Payer: Self-pay | Admitting: Emergency Medicine

## 2011-06-09 DIAGNOSIS — L02219 Cutaneous abscess of trunk, unspecified: Secondary | ICD-10-CM | POA: Insufficient documentation

## 2011-06-09 DIAGNOSIS — R109 Unspecified abdominal pain: Secondary | ICD-10-CM | POA: Insufficient documentation

## 2011-06-09 DIAGNOSIS — L03319 Cellulitis of trunk, unspecified: Secondary | ICD-10-CM | POA: Insufficient documentation

## 2011-06-09 DIAGNOSIS — J45909 Unspecified asthma, uncomplicated: Secondary | ICD-10-CM | POA: Insufficient documentation

## 2011-06-09 DIAGNOSIS — F909 Attention-deficit hyperactivity disorder, unspecified type: Secondary | ICD-10-CM | POA: Insufficient documentation

## 2011-06-09 DIAGNOSIS — A499 Bacterial infection, unspecified: Secondary | ICD-10-CM | POA: Insufficient documentation

## 2011-06-09 DIAGNOSIS — N760 Acute vaginitis: Secondary | ICD-10-CM | POA: Insufficient documentation

## 2011-06-09 DIAGNOSIS — B9689 Other specified bacterial agents as the cause of diseases classified elsewhere: Secondary | ICD-10-CM | POA: Insufficient documentation

## 2011-06-09 DIAGNOSIS — Z79899 Other long term (current) drug therapy: Secondary | ICD-10-CM | POA: Insufficient documentation

## 2011-06-09 DIAGNOSIS — K089 Disorder of teeth and supporting structures, unspecified: Secondary | ICD-10-CM | POA: Insufficient documentation

## 2011-06-09 DIAGNOSIS — F341 Dysthymic disorder: Secondary | ICD-10-CM | POA: Insufficient documentation

## 2011-06-09 DIAGNOSIS — L089 Local infection of the skin and subcutaneous tissue, unspecified: Secondary | ICD-10-CM

## 2011-06-09 DIAGNOSIS — K047 Periapical abscess without sinus: Secondary | ICD-10-CM | POA: Insufficient documentation

## 2011-06-09 NOTE — ED Notes (Signed)
PT. REPORTS LOW ABDOMINAL PAIN FOR 2 DAYS , NAUSEA - NO VOMITTING , DIARRHEA  X4 DAYS ,  CHILLS , NO FEVER .

## 2011-06-10 LAB — BASIC METABOLIC PANEL
CO2: 27 mEq/L (ref 19–32)
Chloride: 101 mEq/L (ref 96–112)
Glucose, Bld: 123 mg/dL — ABNORMAL HIGH (ref 70–99)
Potassium: 3.8 mEq/L (ref 3.5–5.1)
Sodium: 137 mEq/L (ref 135–145)

## 2011-06-10 LAB — CBC
MCV: 78.7 fL (ref 78.0–100.0)
Platelets: 234 10*3/uL (ref 150–400)
RBC: 4.6 MIL/uL (ref 3.87–5.11)
WBC: 8.3 10*3/uL (ref 4.0–10.5)

## 2011-06-10 LAB — WET PREP, GENITAL
Trich, Wet Prep: NONE SEEN
Yeast Wet Prep HPF POC: NONE SEEN

## 2011-06-10 LAB — URINALYSIS, ROUTINE W REFLEX MICROSCOPIC
Glucose, UA: NEGATIVE mg/dL
Hgb urine dipstick: NEGATIVE
Leukocytes, UA: NEGATIVE
Specific Gravity, Urine: 1.027 (ref 1.005–1.030)
pH: 6 (ref 5.0–8.0)

## 2011-06-10 LAB — DIFFERENTIAL
Eosinophils Relative: 5 % (ref 0–5)
Lymphocytes Relative: 39 % (ref 12–46)
Lymphs Abs: 3.2 10*3/uL (ref 0.7–4.0)
Neutro Abs: 4.2 10*3/uL (ref 1.7–7.7)
Neutrophils Relative %: 50 % (ref 43–77)

## 2011-06-10 LAB — POCT PREGNANCY, URINE: Preg Test, Ur: NEGATIVE

## 2011-06-10 MED ORDER — METRONIDAZOLE 500 MG PO TABS: 500.0000 mg | ORAL_TABLET | Freq: Two times a day (BID) | ORAL | Status: AC

## 2011-06-10 MED ORDER — OXYCODONE-ACETAMINOPHEN 5-325 MG PO TABS
2.0000 | ORAL_TABLET | Freq: Once | ORAL | Status: AC
  Administered 2011-06-10: 2 via ORAL
  Filled 2011-06-10: qty 2

## 2011-06-10 MED ORDER — HYDROCODONE-ACETAMINOPHEN 7.5-325 MG/15ML PO SOLN: 15.0000 mL | ORAL | Status: AC | PRN

## 2011-06-10 MED ORDER — CLINDAMYCIN HCL 150 MG PO CAPS: 300.0000 mg | ORAL_CAPSULE | Freq: Three times a day (TID) | ORAL | Status: AC

## 2011-06-10 NOTE — ED Provider Notes (Signed)
Medical screening examination/treatment/procedure(s) were performed by non-physician practitioner and as supervising physician I was immediately available for consultation/collaboration.   Ameri Cahoon D Avereigh Spainhower, MD 06/10/11 0816 

## 2011-06-10 NOTE — ED Provider Notes (Signed)
History     CSN: [id number] Arrival date & time: 06/09/2011 11:33 PM   First MD Initiated Contact with Patient 06/10/11 0248      Chief Complaint  Patient presents with  . Abdominal Pain     Patient is a 25 y.o. female presenting with abdominal pain. The history is provided by the patient.  Abdominal Pain The primary symptoms of the illness include abdominal pain. The primary symptoms of the illness do not include fever. The current episode started more than 2 days ago. The onset of the illness was gradual. The problem has been gradually worsening.  The patient states that she believes she is currently not pregnant. The patient has not had a change in bowel habit. Symptoms associated with the illness do not include chills, urgency, frequency or back pain.   reports persistent intermittent lower abdominal pain times several weeks. Admits to white vaginal discharge and recent unprotected intercourse. Patient also complains of area on her recent C-section scar it is tender to palpation. Patient also complaining of left lower tooth ache. Known wisdom tooth, that has become infected. States that she was supposed to have it pulled but she was pregnant at that time and they were unwilling to pull it. Patient is 3 months postpartum.   Past Medical History  Diagnosis Date  . Asthma   . Anxiety   . Depression   . ADHD (attention deficit hyperactivity disorder)   . ADHD (attention deficit hyperactivity disorder)   . PTSD (post-traumatic stress disorder)   . Gestational diabetes     Past Surgical History  Procedure Date  . Cesarean section   . Tonsillectomy   . Addnoidectomy   . Cesarean section 02/10/2011    Procedure: CESAREAN SECTION;  Surgeon: Esmeralda Arthur, MD;  Location: [facility];  Service: Gynecology;  Laterality: N/A;  Repeat    No family history on file.  History  Substance Use Topics  . Smoking status: Current Everyday Smoker -- 0.2 packs/day    Types: Cigarettes  .  Smokeless tobacco: Not on file  . Alcohol Use: No    OB History    Grav Para Term Preterm Abortions TAB SAB Ect Mult Living   3 2 2       2       Review of Systems  Constitutional: Negative.  Negative for fever and chills.  HENT: Negative.   Eyes: Negative.   Respiratory: Negative.   Cardiovascular: Negative.   Gastrointestinal: Positive for abdominal pain.  Genitourinary: Negative.  Negative for urgency and frequency.  Musculoskeletal: Negative.  Negative for back pain.  Skin: Negative.   Neurological: Negative.   Hematological: Negative.   Psychiatric/Behavioral: Negative.     Allergies  Review of patient's allergies indicates no known allergies.  Home Medications   Current Outpatient Rx  Name Route Sig Dispense Refill  . ALBUTEROL SULFATE HFA 108 (90 BASE) MCG/ACT IN AERS Inhalation Inhale 1 puff into the lungs every 4 (four) hours as needed. For asthma    . ARIPIPRAZOLE 2 MG PO TABS Oral Take 2 mg by mouth daily.      Marland Kitchen FLUOXETINE HCL 20 MG PO TABS Oral Take 20 mg by mouth daily.        BP 147/99  Pulse 87  Temp(Src) 97.9 F (36.6 C) (Oral)  Resp 19  SpO2 98%  Breastfeeding? Unknown  Physical Exam  Constitutional: She is oriented to person, place, and time. She appears well-developed and well-nourished.  HENT:  Head: Normocephalic  and atraumatic.  Eyes: Conjunctivae are normal.  Neck: Neck supple.  Cardiovascular: Normal rate and regular rhythm.   Pulmonary/Chest: Effort normal and breath sounds normal.  Abdominal: Soft. Bowel sounds are normal.  Genitourinary: Vagina normal and uterus normal. Pelvic exam was performed with patient supine. There is no rash or tenderness on the right labia. There is no rash or tenderness on the left labia. Uterus is not tender. Cervix exhibits discharge. Cervix exhibits no motion tenderness and no friability. Right adnexum displays no mass, no tenderness and no fullness. Left adnexum displays no mass, no tenderness and no  fullness.  Musculoskeletal: Normal range of motion.  Neurological: She is alert and oriented to person, place, and time.  Skin: Skin is warm and dry. Rash noted. Rash is papular. No erythema.       Small approx 5 to 6 mm area in the mid portion of pt's recent c/s scar that is slightly raised and erythematous. There is a scant amount of pus that oozes from it. There is no firmess or induration or palpable nodule that would be c/w abscess.  Psychiatric: She has a normal mood and affect.    ED Course  Pelvic exam Date/Time: 06/10/2011 5:00 AM Performed by: Leanne Chang Authorized by: Leanne Chang Consent: Verbal consent obtained. Risks and benefits: risks, benefits and alternatives were discussed Consent given by: patient Patient understanding: patient states understanding of the procedure being performed Required items: required blood products, implants, devices, and special equipment available Patient identity confirmed: verbally with patient and arm band Local anesthesia used: no Patient sedated: no Patient tolerance: Patient tolerated the procedure well with no immediate complications.    5:32 AM Findings and impression discussed with patient. Will plan for discharge home with treatment for abscessed tooth superficial skin infection and bacterial vaginosis. Will encourage patient to arrange f/u w/ a dentist soon and to get established with a local primary care physician and she just moved into the area. Will provide PCP and dental references. Pt is agreeable w/ plan   Labs Reviewed  CBC - Abnormal; Notable for the following:    Hemoglobin 11.6 (*)    MCH 25.2 (*)    RDW 15.7 (*)    All other components within normal limits  BASIC METABOLIC PANEL - Abnormal; Notable for the following:    Glucose, Bld 123 (*)    All other components within normal limits  URINALYSIS, ROUTINE W REFLEX MICROSCOPIC - Abnormal; Notable for the following:    APPearance HAZY (*)    All  other components within normal limits  WET PREP, GENITAL - Abnormal; Notable for the following:    Clue Cells, Wet Prep MODERATE (*)    WBC, Wet Prep HPF POC FEW (*)    All other components within normal limits  DIFFERENTIAL  POCT PREGNANCY, URINE  POCT PREGNANCY, URINE  GC/CHLAMYDIA PROBE AMP, GENITAL   No results found.   No diagnosis found.    MDM  HPI, PE and clinical findings c/w abscessed tooth, small superficial infection on lower abd scar and BV.        Leanne Chang, NP 06/10/11 [phone]

## 2011-06-11 LAB — GC/CHLAMYDIA PROBE AMP, GENITAL
Chlamydia, DNA Probe: NEGATIVE
GC Probe Amp, Genital: NEGATIVE

## 2011-07-16 ENCOUNTER — Encounter (HOSPITAL_COMMUNITY): Payer: Self-pay

## 2011-07-16 ENCOUNTER — Emergency Department (HOSPITAL_COMMUNITY): Payer: Medicaid Other

## 2011-07-16 ENCOUNTER — Emergency Department (HOSPITAL_COMMUNITY)
Admission: EM | Admit: 2011-07-16 | Discharge: 2011-07-16 | Disposition: A | Discharge status: Home / Self Care | Payer: Medicaid Other | Attending: Emergency Medicine | Admitting: Emergency Medicine

## 2011-07-16 DIAGNOSIS — J45909 Unspecified asthma, uncomplicated: Secondary | ICD-10-CM | POA: Insufficient documentation

## 2011-07-16 DIAGNOSIS — R110 Nausea: Secondary | ICD-10-CM | POA: Insufficient documentation

## 2011-07-16 DIAGNOSIS — F909 Attention-deficit hyperactivity disorder, unspecified type: Secondary | ICD-10-CM | POA: Insufficient documentation

## 2011-07-16 DIAGNOSIS — IMO0001 Reserved for inherently not codable concepts without codable children: Secondary | ICD-10-CM | POA: Insufficient documentation

## 2011-07-16 DIAGNOSIS — R509 Fever, unspecified: Secondary | ICD-10-CM | POA: Insufficient documentation

## 2011-07-16 DIAGNOSIS — R51 Headache: Secondary | ICD-10-CM

## 2011-07-16 DIAGNOSIS — F341 Dysthymic disorder: Secondary | ICD-10-CM | POA: Insufficient documentation

## 2011-07-16 DIAGNOSIS — R6889 Other general symptoms and signs: Secondary | ICD-10-CM

## 2011-07-16 LAB — CBC
HCT: 37.1 % (ref 36.0–46.0)
MCHC: 33.2 g/dL (ref 30.0–36.0)
MCV: 78.1 fL (ref 78.0–100.0)
RDW: 14.7 % (ref 11.5–15.5)

## 2011-07-16 LAB — BASIC METABOLIC PANEL
BUN: 8 mg/dL (ref 6–23)
CO2: 22 mEq/L (ref 19–32)
Calcium: 9.5 mg/dL (ref 8.4–10.5)
Creatinine, Ser: 0.9 mg/dL (ref 0.50–1.10)

## 2011-07-16 LAB — DIFFERENTIAL
Basophils Absolute: 0 10*3/uL (ref 0.0–0.1)
Basophils Relative: 0 % (ref 0–1)
Eosinophils Relative: 0 % (ref 0–5)
Monocytes Absolute: 0.6 10*3/uL (ref 0.1–1.0)
Monocytes Relative: 7 % (ref 3–12)

## 2011-07-16 MED ORDER — ACETAMINOPHEN 500 MG PO TABS
1000.0000 mg | ORAL_TABLET | Freq: Once | ORAL | Status: AC
  Administered 2011-07-16: 1000 mg via ORAL
  Filled 2011-07-16: qty 2

## 2011-07-16 MED ORDER — SODIUM CHLORIDE 0.9 % IV BOLUS (SEPSIS)
1000.0000 mL | Freq: Once | INTRAVENOUS | Status: AC
  Administered 2011-07-16: 1000 mL via INTRAVENOUS

## 2011-07-16 MED ORDER — MORPHINE SULFATE 2 MG/ML IJ SOLN
2.0000 mg | Freq: Once | INTRAMUSCULAR | Status: AC
  Administered 2011-07-16: 2 mg via INTRAVENOUS
  Filled 2011-07-16: qty 1

## 2011-07-16 MED ORDER — KETOROLAC TROMETHAMINE 30 MG/ML IJ SOLN
30.0000 mg | Freq: Once | INTRAMUSCULAR | Status: AC
  Administered 2011-07-16: 30 mg via INTRAVENOUS
  Filled 2011-07-16: qty 1

## 2011-07-16 MED ORDER — LIDOCAINE HCL (PF) 1 % IJ SOLN
INTRAMUSCULAR | Status: AC
  Filled 2011-07-16: qty 10

## 2011-07-16 NOTE — ED Provider Notes (Signed)
History     CSN: [id number]  Arrival date & time 07/16/11  1757   First MD Initiated Contact with Patient 07/16/11 1849      Chief Complaint  Patient presents with  . Headache    (Consider location/radiation/quality/duration/timing/severity/associated sxs/prior treatment) Patient is a 26 y.o. female presenting with headaches. The history is provided by the patient.  Headache  This is a new problem. The current episode started 6 to 12 hours ago. The problem has not changed since onset.The headache is associated with bright light. The pain is located in the temporal and bilateral region. The quality of the pain is described as sharp and throbbing. The pain is at a severity of 5/10. The pain is moderate. The pain does not radiate. Associated symptoms include a fever (103 here) and nausea. Pertinent negatives include no syncope, no shortness of breath and no vomiting. She has tried NSAIDs for the symptoms. The treatment provided no relief.    Past Medical History  Diagnosis Date  . Asthma   . Anxiety   . Depression   . ADHD (attention deficit hyperactivity disorder)   . ADHD (attention deficit hyperactivity disorder)   . PTSD (post-traumatic stress disorder)   . Gestational diabetes     Past Surgical History  Procedure Date  . Cesarean section   . Tonsillectomy   . Addnoidectomy   . Cesarean section 02/10/2011    Procedure: CESAREAN SECTION;  Surgeon: Esmeralda Arthur, MD;  Location: [facility];  Service: Gynecology;  Laterality: N/A;  Repeat    No family history on file.  History  Substance Use Topics  . Smoking status: Current Everyday Smoker -- 0.2 packs/day    Types: Cigarettes  . Smokeless tobacco: Not on file  . Alcohol Use: No    OB History    Grav Para Term Preterm Abortions TAB SAB Ect Mult Living   3 2 2       2       Review of Systems  Constitutional: Positive for fever (103 here) and chills.  Respiratory: Negative for cough and shortness of breath.     Cardiovascular: Negative for chest pain, leg swelling and syncope.  Gastrointestinal: Positive for nausea. Negative for vomiting.  Musculoskeletal: Positive for myalgias (diffuse).  Skin: Negative for rash.  Neurological: Positive for headaches.  All other systems reviewed and are negative.    Allergies  Review of patient's allergies indicates no known allergies.  Home Medications   Current Outpatient Rx  Name Route Sig Dispense Refill  . ALBUTEROL SULFATE HFA 108 (90 BASE) MCG/ACT IN AERS Inhalation Inhale 1 puff into the lungs every 4 (four) hours as needed. For asthma    . ARIPIPRAZOLE 2 MG PO TABS Oral Take 2 mg by mouth daily.      Marland Kitchen FLUOXETINE HCL 20 MG PO TABS Oral Take 20 mg by mouth daily.        BP 161/89  Pulse 123  Temp(Src) 103 F (39.4 C) (Oral)  Resp 24  Ht 5\' 7"  (1.702 m)  Wt 220 lb (99.791 kg)  BMI 34.46 kg/m2  SpO2 100%  LMP 07/16/2011  Physical Exam  Nursing note and vitals reviewed. Constitutional: She is oriented to person, place, and time. She appears well-developed and well-nourished. No distress.  HENT:  Head: Normocephalic and atraumatic.  Eyes: EOM are normal. Pupils are equal, round, and reactive to light.  Neck: Normal range of motion. Neck supple.  Cardiovascular: Normal rate and regular rhythm.  Exam reveals  no friction rub.   No murmur heard. Pulmonary/Chest: Effort normal and breath sounds normal. No respiratory distress. She has no wheezes. She has no rales.  Abdominal: Soft. She exhibits no distension. There is no tenderness. There is no rebound.  Musculoskeletal: Normal range of motion. She exhibits no edema.  Neurological: She is alert and oriented to person, place, and time. No cranial nerve deficit. She exhibits normal muscle tone. Coordination normal.  Skin: She is not diaphoretic.    ED Course  Procedures (including critical care time)  Labs Reviewed  CBC - Abnormal; Notable for the following:    MCH 25.9 (*)    All other  components within normal limits  BASIC METABOLIC PANEL - Abnormal; Notable for the following:    Sodium 134 (*)    Glucose, Bld 123 (*)    GFR calc non Af Amer 89 (*)    All other components within normal limits  DIFFERENTIAL   Ct Head Wo Contrast  07/16/2011  *RADIOLOGY REPORT*  Clinical Data:  Headache  CT HEAD WITHOUT CONTRAST  Technique:  Contiguous axial images were obtained from the base of the skull through the vertex without contrast  Comparison:  None.  Findings:  The brain has a normal appearance without evidence for hemorrhage, acute infarction, hydrocephalus, or mass lesion.  There is no extra axial fluid collection.  The skull and paranasal sinuses are normal.  IMPRESSION: Normal CT of the head without contrast.  Original Report Authenticated By: Judie Petit. Ruel Favors, M.D.     1. Headache   2. Flu-like symptoms       MDM  26 year old female who presents with headache and fever. Started today. She did not receive a flu shot. She has diffuse body aches as well. She's easily ambulatory around the ED. She is alert and oriented, no obtundation. She reports photophobia, however is walking around with the lights on and does not request like to return off in her room. She has no neuro deficits. Lungs are clear, heart sounds are normal. She is initially tachycardic and febrile, given antipyretics. TMs are normal. Nose is clear. Neck is supple without any range of motion deficits. Patient's symptoms are consistent with the flu. There is no concern for meningitis as there is no nuchal rigidity or obtundation. Patient given medicine for her headache. CT of head normal. No intracranial cause for headache. Labs normal. Patient instructed to followup with a regular doctor and was instructed to get a flu shot. Discharge home in stable condition.   Elwin Mocha, MD 07/16/11 2329

## 2011-07-16 NOTE — ED Notes (Signed)
Patient back from CT.

## 2011-07-16 NOTE — ED Notes (Signed)
Pt presents with onset of bilateral temporal headache that began today.  +photophobia.  Pt reports chills, productive cough with green phlegm and nasal congestion.

## 2011-07-16 NOTE — ED Provider Notes (Signed)
I saw and evaluated the patient, reviewed the resident's note and I agree with the findings and plan.  Patient examined. She has no nuchal rigidity. She has no rashes. She's able to cannulate in the department without difficulty appearance does not appear to be photophobic at this time..The patient has meningitis at this time. Discussed the possibility of doing a lumbar puncture and she is agreeable to waiting at this time. She was instructed to return tomorrow for recheck or return sooner should her symptoms worsen.  Toy Baker, MD 07/16/11 2101

## 2011-07-17 NOTE — ED Provider Notes (Signed)
I saw and evaluated the patient, reviewed the resident's note and I agree with the findings and plan.  Izeah Vossler T Jazell Rosenau, MD 07/17/11 1527 

## 2011-07-29 ENCOUNTER — Emergency Department (HOSPITAL_COMMUNITY)
Admission: EM | Admit: 2011-07-29 | Discharge: 2011-07-29 | Disposition: A | Discharge status: Home / Self Care | Payer: Medicaid Other | Attending: Emergency Medicine | Admitting: Emergency Medicine

## 2011-07-29 ENCOUNTER — Encounter (HOSPITAL_COMMUNITY): Payer: Self-pay

## 2011-07-29 DIAGNOSIS — R10819 Abdominal tenderness, unspecified site: Secondary | ICD-10-CM | POA: Insufficient documentation

## 2011-07-29 DIAGNOSIS — N390 Urinary tract infection, site not specified: Secondary | ICD-10-CM | POA: Insufficient documentation

## 2011-07-29 DIAGNOSIS — R509 Fever, unspecified: Secondary | ICD-10-CM | POA: Insufficient documentation

## 2011-07-29 DIAGNOSIS — E860 Dehydration: Secondary | ICD-10-CM | POA: Insufficient documentation

## 2011-07-29 LAB — CBC
HCT: 33.8 % — ABNORMAL LOW (ref 36.0–46.0)
Hemoglobin: 11.1 g/dL — ABNORMAL LOW (ref 12.0–15.0)
MCH: 25.5 pg — ABNORMAL LOW (ref 26.0–34.0)
MCHC: 32.8 g/dL (ref 30.0–36.0)
MCV: 77.5 fL — ABNORMAL LOW (ref 78.0–100.0)
RBC: 4.36 MIL/uL (ref 3.87–5.11)

## 2011-07-29 LAB — URINALYSIS, ROUTINE W REFLEX MICROSCOPIC
Bilirubin Urine: NEGATIVE
Glucose, UA: NEGATIVE mg/dL
Ketones, ur: NEGATIVE mg/dL
Protein, ur: NEGATIVE mg/dL

## 2011-07-29 LAB — DIFFERENTIAL
Basophils Relative: 0 % (ref 0–1)
Eosinophils Absolute: 0 10*3/uL (ref 0.0–0.7)
Eosinophils Relative: 1 % (ref 0–5)
Lymphs Abs: 1.6 10*3/uL (ref 0.7–4.0)
Monocytes Absolute: 0.6 10*3/uL (ref 0.1–1.0)
Monocytes Relative: 7 % (ref 3–12)
Neutrophils Relative %: 74 % (ref 43–77)

## 2011-07-29 LAB — BASIC METABOLIC PANEL
BUN: 7 mg/dL (ref 6–23)
Calcium: 9.5 mg/dL (ref 8.4–10.5)
Creatinine, Ser: 1.06 mg/dL (ref 0.50–1.10)
GFR calc Af Amer: 85 mL/min — ABNORMAL LOW (ref >90)
GFR calc non Af Amer: 73 mL/min — ABNORMAL LOW (ref >90)
Glucose, Bld: 117 mg/dL — ABNORMAL HIGH (ref 70–99)

## 2011-07-29 LAB — URINE MICROSCOPIC-ADD ON

## 2011-07-29 MED ORDER — SODIUM CHLORIDE 0.9 % IV BOLUS (SEPSIS)
1000.0000 mL | Freq: Once | INTRAVENOUS | Status: AC
  Administered 2011-07-29: 1000 mL via INTRAVENOUS

## 2011-07-29 MED ORDER — ACETAMINOPHEN 325 MG PO TABS
650.0000 mg | ORAL_TABLET | Freq: Once | ORAL | Status: AC
  Administered 2011-07-29: 650 mg via ORAL
  Filled 2011-07-29: qty 2

## 2011-07-29 MED ORDER — ONDANSETRON HCL 4 MG/2ML IJ SOLN
4.0000 mg | Freq: Once | INTRAMUSCULAR | Status: AC
  Administered 2011-07-29: 4 mg via INTRAVENOUS
  Filled 2011-07-29: qty 2

## 2011-07-29 MED ORDER — MORPHINE SULFATE 4 MG/ML IJ SOLN
4.0000 mg | Freq: Once | INTRAMUSCULAR | Status: AC
  Administered 2011-07-29: 4 mg via INTRAVENOUS
  Filled 2011-07-29: qty 1

## 2011-07-29 MED ORDER — IBUPROFEN 800 MG PO TABS
800.0000 mg | ORAL_TABLET | Freq: Once | ORAL | Status: AC
  Administered 2011-07-29: 800 mg via ORAL
  Filled 2011-07-29: qty 1

## 2011-07-29 MED ORDER — DEXTROSE 5 % IV SOLN
1.0000 g | Freq: Once | INTRAVENOUS | Status: AC
  Administered 2011-07-29: 1 g via INTRAVENOUS
  Filled 2011-07-29: qty 10

## 2011-07-29 MED ORDER — CIPROFLOXACIN HCL 500 MG PO TABS: 500.0000 mg | ORAL_TABLET | Freq: Two times a day (BID) | ORAL | Status: AC

## 2011-07-29 NOTE — ED Notes (Signed)
Patient reports that she was seen at [facility] 3 weeks ago with fever and had blood work with no diagnosis. Reports that the fever returned yesterday with fever greater than 103 and back pain. No dysuria

## 2011-07-29 NOTE — ED Provider Notes (Signed)
History     CSN: [id number]  Arrival date & time 07/29/11  1618   First MD Initiated Contact with Patient 07/29/11 1648      No chief complaint on file.   (Consider location/radiation/quality/duration/timing/severity/associated sxs/prior treatment) HPI.    Fever and bilateral flank pain since last night. Patient was seen for flulike symptoms approximately 2 weeks ago at [facility] emergency department. She got better from that episode. Symptoms returned last evening. She complains of chills also. No frank dysuria.  Decreased oral intake. Nothing makes symptoms better or worse. Described as moderate. no radiation of flank pain  Past Medical History  Diagnosis Date  . Asthma   . Anxiety   . Depression   . ADHD (attention deficit hyperactivity disorder)   . ADHD (attention deficit hyperactivity disorder)   . PTSD (post-traumatic stress disorder)   . Gestational diabetes     Past Surgical History  Procedure Date  . Cesarean section   . Tonsillectomy   . Addnoidectomy   . Cesarean section 02/10/2011    Procedure: CESAREAN SECTION;  Surgeon: Esmeralda Arthur, MD;  Location: [facility];  Service: Gynecology;  Laterality: N/A;  Repeat    No family history on file.  History  Substance Use Topics  . Smoking status: Current Everyday Smoker -- 0.2 packs/day    Types: Cigarettes  . Smokeless tobacco: Not on file  . Alcohol Use: No    OB History    Grav Para Term Preterm Abortions TAB SAB Ect Mult Living   3 2 2       2       Review of Systems  All other systems reviewed and are negative.    Allergies  Review of patient's allergies indicates no known allergies.  Home Medications   Current Outpatient Rx  Name Route Sig Dispense Refill  . ALBUTEROL SULFATE HFA 108 (90 BASE) MCG/ACT IN AERS Inhalation Inhale 1 puff into the lungs every 4 (four) hours as needed. For asthma    . ARIPIPRAZOLE 2 MG PO TABS Oral Take 2 mg by mouth daily.      Marland Kitchen CIPROFLOXACIN HCL 500 MG PO TABS  Oral Take 1 tablet (500 mg total) by mouth every 12 (twelve) hours. 14 tablet 0  . FLUOXETINE HCL 20 MG PO TABS Oral Take 20 mg by mouth daily.        BP 111/65  Pulse 123  Temp(Src) 98.7 F (37.1 C) (Oral)  Resp 16  SpO2 97%  LMP 07/16/2011  Physical Exam  Nursing note and vitals reviewed. Constitutional: She is oriented to person, place, and time. She appears well-developed and well-nourished.       Patient is cold and having chills. Slightly dehydrated  HENT:  Head: Normocephalic and atraumatic.  Eyes: Conjunctivae and EOM are normal. Pupils are equal, round, and reactive to light.  Neck: Normal range of motion. Neck supple.  Cardiovascular: Normal rate and regular rhythm.   Pulmonary/Chest: Effort normal and breath sounds normal.  Abdominal: Soft. Bowel sounds are normal.  Genitourinary:       Minimal bilateral flank tenderness  Musculoskeletal: Normal range of motion.  Neurological: She is alert and oriented to person, place, and time.  Skin: Skin is warm and dry.  Psychiatric: She has a normal mood and affect.    ED Course  Procedures (including critical care time)  Labs Reviewed  URINALYSIS, ROUTINE W REFLEX MICROSCOPIC - Abnormal; Notable for the following:    APPearance CLOUDY (*)  Nitrite POSITIVE (*)    Leukocytes, UA SMALL (*)    All other components within normal limits  CBC - Abnormal; Notable for the following:    Hemoglobin 11.1 (*)    HCT 33.8 (*)    MCV 77.5 (*)    MCH 25.5 (*)    All other components within normal limits  BASIC METABOLIC PANEL - Abnormal; Notable for the following:    Sodium 132 (*)    Glucose, Bld 117 (*)    GFR calc non Af Amer 73 (*)    GFR calc Af Amer 85 (*)    All other components within normal limits  URINE MICROSCOPIC-ADD ON - Abnormal; Notable for the following:    Bacteria, UA MANY (*)    All other components within normal limits  POCT PREGNANCY, URINE  DIFFERENTIAL  URINE CULTURE   No results found.   1.  Urinary tract infection       MDM  History and physical most consistent with urinary tract infection, possibly early pyelonephritis. Urinalysis confirms. Will hydrate, IV Rocephin, culture urine, Tylenol for fever.  Patient rechecked at 2230: Feeling much better. Chills have dispersed.  Discussed diagnosis with patient and her friend.  Will treat as outpatient      Donnetta Hutching, MD 07/29/11 2247

## 2011-07-29 NOTE — ED Notes (Signed)
Per ems:  Pt had the flu 2 weeks ago. Symptoms went away until last night her fever. Pt states he fever today oral 103.8. Pt is alert and oriented. Pt took tylenol without relief. C/o lower back pain. Denies any n/v/d or chest pain

## 2011-07-31 LAB — URINE CULTURE
Colony Count: >=100000
Culture  Setup Time: 201301300143

## 2011-08-01 NOTE — ED Notes (Signed)
+   Urine Patient treated with Cipro-sensitive to same-Chart appended per protocol MD 

## 2011-08-18 ENCOUNTER — Encounter (HOSPITAL_COMMUNITY): Payer: Self-pay | Admitting: Emergency Medicine

## 2011-08-18 ENCOUNTER — Emergency Department (HOSPITAL_COMMUNITY): Payer: Medicaid Other

## 2011-08-18 ENCOUNTER — Emergency Department (HOSPITAL_COMMUNITY)
Admission: EM | Admit: 2011-08-18 | Discharge: 2011-08-18 | Disposition: A | Discharge status: Home / Self Care | Payer: Medicaid Other | Attending: Emergency Medicine | Admitting: Emergency Medicine

## 2011-08-18 DIAGNOSIS — N949 Unspecified condition associated with female genital organs and menstrual cycle: Secondary | ICD-10-CM | POA: Insufficient documentation

## 2011-08-18 DIAGNOSIS — F909 Attention-deficit hyperactivity disorder, unspecified type: Secondary | ICD-10-CM | POA: Insufficient documentation

## 2011-08-18 DIAGNOSIS — A499 Bacterial infection, unspecified: Secondary | ICD-10-CM | POA: Insufficient documentation

## 2011-08-18 DIAGNOSIS — Z79899 Other long term (current) drug therapy: Secondary | ICD-10-CM | POA: Insufficient documentation

## 2011-08-18 DIAGNOSIS — N760 Acute vaginitis: Secondary | ICD-10-CM | POA: Insufficient documentation

## 2011-08-18 DIAGNOSIS — R110 Nausea: Secondary | ICD-10-CM | POA: Insufficient documentation

## 2011-08-18 DIAGNOSIS — F341 Dysthymic disorder: Secondary | ICD-10-CM | POA: Insufficient documentation

## 2011-08-18 DIAGNOSIS — B9689 Other specified bacterial agents as the cause of diseases classified elsewhere: Secondary | ICD-10-CM | POA: Insufficient documentation

## 2011-08-18 DIAGNOSIS — R102 Pelvic and perineal pain: Secondary | ICD-10-CM

## 2011-08-18 DIAGNOSIS — R10819 Abdominal tenderness, unspecified site: Secondary | ICD-10-CM | POA: Insufficient documentation

## 2011-08-18 DIAGNOSIS — J45909 Unspecified asthma, uncomplicated: Secondary | ICD-10-CM | POA: Insufficient documentation

## 2011-08-18 DIAGNOSIS — F17200 Nicotine dependence, unspecified, uncomplicated: Secondary | ICD-10-CM | POA: Insufficient documentation

## 2011-08-18 LAB — CBC
Hemoglobin: 12.1 g/dL (ref 12.0–15.0)
MCH: 26.2 pg (ref 26.0–34.0)
MCV: 77.9 fL — ABNORMAL LOW (ref 78.0–100.0)
RBC: 4.62 MIL/uL (ref 3.87–5.11)

## 2011-08-18 LAB — BASIC METABOLIC PANEL
BUN: 13 mg/dL (ref 6–23)
GFR calc non Af Amer: 79 mL/min — ABNORMAL LOW (ref >90)
Glucose, Bld: 120 mg/dL — ABNORMAL HIGH (ref 70–99)
Potassium: 3.9 mEq/L (ref 3.5–5.1)

## 2011-08-18 LAB — DIFFERENTIAL
Eosinophils Absolute: 0.3 10*3/uL (ref 0.0–0.7)
Lymphs Abs: 2.8 10*3/uL (ref 0.7–4.0)
Monocytes Relative: 5 % (ref 3–12)
Neutrophils Relative %: 51 % (ref 43–77)

## 2011-08-18 LAB — URINALYSIS, ROUTINE W REFLEX MICROSCOPIC
Bilirubin Urine: NEGATIVE
Glucose, UA: NEGATIVE mg/dL
Hgb urine dipstick: NEGATIVE
Specific Gravity, Urine: 1.016 (ref 1.005–1.030)
pH: 6 (ref 5.0–8.0)

## 2011-08-18 LAB — WET PREP, GENITAL
Trich, Wet Prep: NONE SEEN
Yeast Wet Prep HPF POC: NONE SEEN

## 2011-08-18 LAB — GC/CHLAMYDIA PROBE AMP, GENITAL: Chlamydia, DNA Probe: NEGATIVE

## 2011-08-18 MED ORDER — HYDROCODONE-ACETAMINOPHEN 5-325 MG PO TABS: 1.0000 | ORAL_TABLET | ORAL | Status: AC | PRN

## 2011-08-18 MED ORDER — METRONIDAZOLE 500 MG PO TABS: 500.0000 mg | ORAL_TABLET | Freq: Two times a day (BID) | ORAL | Status: AC

## 2011-08-18 MED ORDER — HYDROCODONE-ACETAMINOPHEN 5-325 MG PO TABS
1.0000 | ORAL_TABLET | Freq: Once | ORAL | Status: AC
  Administered 2011-08-18: 1 via ORAL
  Filled 2011-08-18: qty 1

## 2011-08-18 NOTE — ED Notes (Addendum)
C/o sharp lower abd pain and nausea since Friday.  Denies urinary complaints. LMP 07/13/2011.  Pt states she is sexually active.  Reports she has had a lot of vodka to drink tonight.

## 2011-08-18 NOTE — ED Provider Notes (Signed)
History     CSN: [id number]  Arrival date & time 08/18/11  0137   First MD Initiated Contact with Patient 08/18/11 0146      Chief Complaint  Patient presents with  . Abdominal Pain    (Consider location/radiation/quality/duration/timing/severity/associated sxs/prior treatment) HPI Comments: Patient here with sharp episodic lower abdominal pain along the line of her c-section scar - states that this has come and gone - nothing she has done has either helped or hindered the pain - she is also concerned that she may be pregnant - states her period was supposed to start on 14 FEB - states has nausea and mild white discharge without an odor - reports that last child was born 6 months ago - she denies dysuria, hematuria, constipation or diarrhea.  Patient is a 26 y.o. female presenting with abdominal pain. The history is provided by the patient. No language interpreter was used.  Abdominal Pain The primary symptoms of the illness include abdominal pain, nausea and vaginal discharge. The primary symptoms of the illness do not include fever, fatigue, shortness of breath, vomiting, diarrhea, hematemesis, hematochezia, dysuria or vaginal bleeding. The current episode started 2 days ago. The onset of the illness was gradual. The problem has not changed since onset. The vaginal discharge is not associated with dysuria.   Symptoms associated with the illness do not include chills, anorexia, diaphoresis, heartburn, constipation, urgency, hematuria, frequency or back pain.    Past Medical History  Diagnosis Date  . Asthma   . Anxiety   . Depression   . ADHD (attention deficit hyperactivity disorder)   . ADHD (attention deficit hyperactivity disorder)   . PTSD (post-traumatic stress disorder)   . Gestational diabetes     Past Surgical History  Procedure Date  . Cesarean section   . Tonsillectomy   . Addnoidectomy   . Cesarean section 02/10/2011    Procedure: CESAREAN SECTION;  Surgeon:  Esmeralda Arthur, MD;  Location: [facility];  Service: Gynecology;  Laterality: N/A;  Repeat    No family history on file.  History  Substance Use Topics  . Smoking status: Current Everyday Smoker -- 0.2 packs/day    Types: Cigarettes  . Smokeless tobacco: Not on file  . Alcohol Use: Yes    OB History    Grav Para Term Preterm Abortions TAB SAB Ect Mult Living   3 2 2       2       Review of Systems  Constitutional: Negative for fever, chills, diaphoresis and fatigue.  Respiratory: Negative for shortness of breath.   Gastrointestinal: Positive for nausea and abdominal pain. Negative for heartburn, vomiting, diarrhea, constipation, hematochezia, anorexia and hematemesis.  Genitourinary: Positive for vaginal discharge. Negative for dysuria, urgency, frequency, hematuria and vaginal bleeding.  Musculoskeletal: Negative for back pain.  All other systems reviewed and are negative.    Allergies  Review of patient's allergies indicates no known allergies.  Home Medications   Current Outpatient Rx  Name Route Sig Dispense Refill  . ALBUTEROL SULFATE HFA 108 (90 BASE) MCG/ACT IN AERS Inhalation Inhale 1 puff into the lungs every 4 (four) hours as needed. For asthma    . ARIPIPRAZOLE 2 MG PO TABS Oral Take 2 mg by mouth daily.      Marland Kitchen FLUOXETINE HCL 20 MG PO TABS Oral Take 20 mg by mouth daily.        BP 170/92  Pulse 100  Temp(Src) 97.9 F (36.6 C) (Oral)  Resp 18  SpO2 100%  LMP 07/13/2011  Physical Exam  Nursing note and vitals reviewed. Constitutional: She is oriented to person, place, and time. She appears well-developed and well-nourished. No distress.  HENT:  Head: Normocephalic and atraumatic.  Right Ear: External ear normal.  Left Ear: External ear normal.  Nose: Nose normal.  Mouth/Throat: Oropharynx is clear and moist. No oropharyngeal exudate.  Eyes: Conjunctivae are normal. Pupils are equal, round, and reactive to light. No scleral icterus.  Neck: Normal range  of motion. Neck supple.  Cardiovascular: Normal rate, regular rhythm and normal heart sounds.  Exam reveals no gallop and no friction rub.   No murmur heard. Pulmonary/Chest: Effort normal and breath sounds normal. She exhibits no tenderness.  Abdominal: Soft. Bowel sounds are normal. She exhibits no distension and no mass. There is tenderness in the suprapubic area. There is no rebound and no guarding.    Genitourinary: Pelvic exam was performed with patient supine. There is no rash or tenderness on the right labia. There is no rash or tenderness on the left labia. Uterus is tender. Uterus is not enlarged. Cervix exhibits no motion tenderness, no discharge and no friability. Right adnexum displays tenderness. Right adnexum displays no mass. Left adnexum displays tenderness. Left adnexum displays no mass. No tenderness or bleeding around the vagina. Vaginal discharge found.  Musculoskeletal: Normal range of motion. She exhibits no edema and no tenderness.  Lymphadenopathy:    She has no cervical adenopathy.  Neurological: She is alert and oriented to person, place, and time. No cranial nerve deficit.  Skin: Skin is warm and dry. No rash noted. No erythema. No pallor.  Psychiatric: She has a normal mood and affect. Her behavior is normal. Judgment and thought content normal.    ED Course  Procedures (including critical care time)  Labs Reviewed  CBC - Abnormal; Notable for the following:    MCV 77.9 (*)    RDW 15.8 (*)    All other components within normal limits  BASIC METABOLIC PANEL - Abnormal; Notable for the following:    Sodium 134 (*)    Glucose, Bld 120 (*)    GFR calc non Af Amer 79 (*)    All other components within normal limits  WET PREP, GENITAL - Abnormal; Notable for the following:    Clue Cells Wet Prep HPF POC MODERATE (*)    WBC, Wet Prep HPF POC FEW (*)    All other components within normal limits  DIFFERENTIAL  URINALYSIS, ROUTINE W REFLEX MICROSCOPIC  POCT  PREGNANCY, URINE  GC/CHLAMYDIA PROBE AMP, GENITAL   No results found. Results for orders placed during the hospital encounter of 08/18/11  CBC      Component Value Range   WBC 7.2  4.0 - 10.5 (K/uL)   RBC 4.62  3.87 - 5.11 (MIL/uL)   Hemoglobin 12.1  12.0 - 15.0 (g/dL)   HCT 60.4  54.0 - 98.1 (%)   MCV 77.9 (*) 78.0 - 100.0 (fL)   MCH 26.2  26.0 - 34.0 (pg)   MCHC 33.6  30.0 - 36.0 (g/dL)   RDW 19.1 (*) 47.8 - 15.5 (%)   Platelets 225  150 - 400 (K/uL)  DIFFERENTIAL      Component Value Range   Neutrophils Relative 51  43 - 77 (%)   Neutro Abs 3.7  1.7 - 7.7 (K/uL)   Lymphocytes Relative 39  12 - 46 (%)   Lymphs Abs 2.8  0.7 - 4.0 (K/uL)   Monocytes  Relative 5  3 - 12 (%)   Monocytes Absolute 0.4  0.1 - 1.0 (K/uL)   Eosinophils Relative 4  0 - 5 (%)   Eosinophils Absolute 0.3  0.0 - 0.7 (K/uL)   Basophils Relative 0  0 - 1 (%)   Basophils Absolute 0.0  0.0 - 0.1 (K/uL)  BASIC METABOLIC PANEL      Component Value Range   Sodium 134 (*) 135 - 145 (mEq/L)   Potassium 3.9  3.5 - 5.1 (mEq/L)   Chloride 101  96 - 112 (mEq/L)   CO2 24  19 - 32 (mEq/L)   Glucose, Bld 120 (*) 70 - 99 (mg/dL)   BUN 13  6 - 23 (mg/dL)   Creatinine, Ser 9.60  0.50 - 1.10 (mg/dL)   Calcium 9.3  8.4 - 45.4 (mg/dL)   GFR calc non Af Amer 79 (*) >90 (mL/min)   GFR calc Af Amer >90  >90 (mL/min)  URINALYSIS, ROUTINE W REFLEX MICROSCOPIC      Component Value Range   Color, Urine YELLOW  YELLOW    APPearance CLEAR  CLEAR    Specific Gravity, Urine 1.016  1.005 - 1.030    pH 6.0  5.0 - 8.0    Glucose, UA NEGATIVE  NEGATIVE (mg/dL)   Hgb urine dipstick NEGATIVE  NEGATIVE    Bilirubin Urine NEGATIVE  NEGATIVE    Ketones, ur NEGATIVE  NEGATIVE (mg/dL)   Protein, ur NEGATIVE  NEGATIVE (mg/dL)   Urobilinogen, UA 0.2  0.0 - 1.0 (mg/dL)   Nitrite NEGATIVE  NEGATIVE    Leukocytes, UA NEGATIVE  NEGATIVE   WET PREP, GENITAL      Component Value Range   Yeast Wet Prep HPF POC NONE SEEN  NONE SEEN    Trich,  Wet Prep NONE SEEN  NONE SEEN    Clue Cells Wet Prep HPF POC MODERATE (*) NONE SEEN    WBC, Wet Prep HPF POC FEW (*) NONE SEEN   POCT PREGNANCY, URINE      Component Value Range   Preg Test, Ur NEGATIVE  NEGATIVE    No results found.   Results for orders placed during the hospital encounter of 08/18/11  CBC      Component Value Range   WBC 7.2  4.0 - 10.5 (K/uL)   RBC 4.62  3.87 - 5.11 (MIL/uL)   Hemoglobin 12.1  12.0 - 15.0 (g/dL)   HCT 09.8  11.9 - 14.7 (%)   MCV 77.9 (*) 78.0 - 100.0 (fL)   MCH 26.2  26.0 - 34.0 (pg)   MCHC 33.6  30.0 - 36.0 (g/dL)   RDW 82.9 (*) 56.2 - 15.5 (%)   Platelets 225  150 - 400 (K/uL)  DIFFERENTIAL      Component Value Range   Neutrophils Relative 51  43 - 77 (%)   Neutro Abs 3.7  1.7 - 7.7 (K/uL)   Lymphocytes Relative 39  12 - 46 (%)   Lymphs Abs 2.8  0.7 - 4.0 (K/uL)   Monocytes Relative 5  3 - 12 (%)   Monocytes Absolute 0.4  0.1 - 1.0 (K/uL)   Eosinophils Relative 4  0 - 5 (%)   Eosinophils Absolute 0.3  0.0 - 0.7 (K/uL)   Basophils Relative 0  0 - 1 (%)   Basophils Absolute 0.0  0.0 - 0.1 (K/uL)  BASIC METABOLIC PANEL      Component Value Range   Sodium 134 (*) 135 - 145 (  mEq/L)   Potassium 3.9  3.5 - 5.1 (mEq/L)   Chloride 101  96 - 112 (mEq/L)   CO2 24  19 - 32 (mEq/L)   Glucose, Bld 120 (*) 70 - 99 (mg/dL)   BUN 13  6 - 23 (mg/dL)   Creatinine, Ser 9.60  0.50 - 1.10 (mg/dL)   Calcium 9.3  8.4 - 45.4 (mg/dL)   GFR calc non Af Amer 79 (*) >90 (mL/min)   GFR calc Af Amer >90  >90 (mL/min)  URINALYSIS, ROUTINE W REFLEX MICROSCOPIC      Component Value Range   Color, Urine YELLOW  YELLOW    APPearance CLEAR  CLEAR    Specific Gravity, Urine 1.016  1.005 - 1.030    pH 6.0  5.0 - 8.0    Glucose, UA NEGATIVE  NEGATIVE (mg/dL)   Hgb urine dipstick NEGATIVE  NEGATIVE    Bilirubin Urine NEGATIVE  NEGATIVE    Ketones, ur NEGATIVE  NEGATIVE (mg/dL)   Protein, ur NEGATIVE  NEGATIVE (mg/dL)   Urobilinogen, UA 0.2  0.0 - 1.0 (mg/dL)    Nitrite NEGATIVE  NEGATIVE    Leukocytes, UA NEGATIVE  NEGATIVE   WET PREP, GENITAL      Component Value Range   Yeast Wet Prep HPF POC NONE SEEN  NONE SEEN    Trich, Wet Prep NONE SEEN  NONE SEEN    Clue Cells Wet Prep HPF POC MODERATE (*) NONE SEEN    WBC, Wet Prep HPF POC FEW (*) NONE SEEN   POCT PREGNANCY, URINE      Component Value Range   Preg Test, Ur NEGATIVE  NEGATIVE    US Transvaginal Non-ob  08/18/2011  *RADIOLOGY REPORT*  Clinical Data: Abdominal and pelvic pain.  TRANSABDOMINAL AND TRANSVAGINAL ULTRASOUND OF PELVIS Technique:  Both transabdominal and transvaginal ultrasound examinations of the pelvis were performed. Transabdominal technique was performed for global imaging of the pelvis including uterus, ovaries, adnexal regions, and pelvic cul-de-sac.  Comparison: None.   It was necessary to proceed with endovaginal exam following the transabdominal exam to visualize the uterus and ovaries in greater detail.  Findings:  Uterus: Normal in size, measuring 9.4 x 5.1 x 7.1 cm.  There is slight inhomogeneity of myometrial echogenicity, without definite focal abnormality.  This likely remains within normal limits.  Endometrium: Normal in thickness and appearance; measures 1.3 cm in thickness, likely reflecting the secretory phase of the cycle.  Right ovary:  Normal appearance/no adnexal mass; measures 3.0 x 2.1 x 2.4 cm.  Left ovary: Normal appearance/no adnexal mass; measures 2.8 x 1.6 x 1.5 cm.  Normal color Doppler blood flow is noted within both ovaries; there is no evidence for ovarian torsion.  Other findings: A small amount of free fluid is noted in the pelvic cul-de-sac.  IMPRESSION: Normal study.  No evidence of pelvic mass or other significant abnormality.  Original Report Authenticated By: Tonia Ghent, M.D.   US Pelvis Complete  08/18/2011  *RADIOLOGY REPORT*  Clinical Data: Abdominal and pelvic pain.  TRANSABDOMINAL AND TRANSVAGINAL ULTRASOUND OF PELVIS Technique:  Both  transabdominal and transvaginal ultrasound examinations of the pelvis were performed. Transabdominal technique was performed for global imaging of the pelvis including uterus, ovaries, adnexal regions, and pelvic cul-de-sac.  Comparison: None.   It was necessary to proceed with endovaginal exam following the transabdominal exam to visualize the uterus and ovaries in greater detail.  Findings:  Uterus: Normal in size, measuring 9.4 x 5.1 x 7.1 cm.  There is slight inhomogeneity of myometrial echogenicity, without definite focal abnormality.  This likely remains within normal limits.  Endometrium: Normal in thickness and appearance; measures 1.3 cm in thickness, likely reflecting the secretory phase of the cycle.  Right ovary:  Normal appearance/no adnexal mass; measures 3.0 x 2.1 x 2.4 cm.  Left ovary: Normal appearance/no adnexal mass; measures 2.8 x 1.6 x 1.5 cm.  Normal color Doppler blood flow is noted within both ovaries; there is no evidence for ovarian torsion.  Other findings: A small amount of free fluid is noted in the pelvic cul-de-sac.  IMPRESSION: Normal study.  No evidence of pelvic mass or other significant abnormality.  Original Report Authenticated By: Tonia Ghent, M.D.    Pelvic pain BV   MDM  Patient with suprapubic abdominal pain - tender with palpation over incisional site - will get [place] to rule out pelvic abnormalities - patient still concerned that she may be pregnant - reassured.      Patient has returned from ultrasound - awaiting results.  Patient with pelvic pain without etiology though wet prep shows BV - will treat for this and short course of pain medication - this could represent scar tissue over incisional site.  Izola Price [place], [place] 08/18/11 [phone]

## 2011-08-18 NOTE — ED Notes (Addendum)
Patient currently sitting up in bed; no respiratory or acute distress noted.  PA and nurse tech at bedside with pelvic cart.  Will continue to monitor.

## 2011-08-18 NOTE — ED Notes (Signed)
Patient currently sitting up in bed; no respiratory or acute distress noted.  Patient updated on plan of care; informed patient that we are currently waiting on test results to come back and for the provider to come and perform pelvic exam on patient.  Patient has no other questions or concerns at this time.  Will continue to monitor.

## 2011-08-18 NOTE — ED Notes (Addendum)
Patient complaining of lower abdominal pain since Friday; patient rates pain 10/10 on the numerical pain scale; patient describes pain as "sharp".  Patient denies any changes in her urine, but states that she has recently been treated for a UTI.  Patient also reporting abnormal vaginal discharge (yellow-tinged in color) for the past two to three days.  Patient states that she has been sexually active, and that she has not been using protection with certain partners.  Patient states that her last period was January 13th, and that she may be pregnant.  Upon arrival to room, patient changed into gown and assisted to restroom to collect urine specimen.  Will continue to monitor.

## 2011-08-18 NOTE — ED Notes (Signed)
Patient back from ultrasound; currently resting quietly in bed.  No respiratory or acute distress noted.  Family member/friend at bedside.  Updated patient on plan of care; informed patient that we are currently waiting on ultrasound results to come back.  Patient has no other questions or concerns at this time; will continue to monitor.

## 2011-08-18 NOTE — ED Notes (Signed)
Pelvic cart set up at bedside  

## 2011-08-18 NOTE — ED Provider Notes (Signed)
Medical screening examination/treatment/procedure(s) were performed by non-physician practitioner and as supervising physician I was immediately available for consultation/collaboration.   Hanley Seamen, MD 08/18/11 [phone]

## 2011-08-18 NOTE — ED Notes (Signed)
Patient given discharge paperwork; went over discharge instructions with patient.  Instructed patient to take Flagyl and Norco as directed, to not drive while taking pain medications, to follow up with a primary care physician, and to return to the ED for new, worsening, or concerning symptoms.

## 2011-08-18 NOTE — Discharge Instructions (Signed)
Bacterial Vaginosis Bacterial vaginosis (BV) is a vaginal infection where the normal balance of bacteria in the vagina is disrupted. The normal balance is then replaced by an overgrowth of certain bacteria. There are several different kinds of bacteria that can cause BV. BV is the most common vaginal infection in women of childbearing age. CAUSES   The cause of BV is not fully understood. BV develops when there is an increase or imbalance of harmful bacteria.   Some activities or behaviors can upset the normal balance of bacteria in the vagina and put women at increased risk including:   Having a new sex partner or multiple sex partners.   Douching.   Using an intrauterine device (IUD) for contraception.   It is not clear what role sexual activity plays in the development of BV. However, women that have never had sexual intercourse are rarely infected with BV.  Women do not get BV from toilet seats, bedding, swimming pools or from touching objects around them.  SYMPTOMS   Grey vaginal discharge.   A fish-like odor with discharge, especially after sexual intercourse.   Itching or burning of the vagina and vulva.   Burning or pain with urination.   Some women have no signs or symptoms at all.  DIAGNOSIS  Your caregiver must examine the vagina for signs of BV. Your caregiver will perform lab tests and look at the sample of vaginal fluid through a microscope. They will look for bacteria and abnormal cells (clue cells), a pH test higher than 4.5, and a positive amine test all associated with BV.  RISKS AND COMPLICATIONS   Pelvic inflammatory disease (PID).   Infections following gynecology surgery.   Developing HIV.   Developing herpes virus.  TREATMENT  Sometimes BV will clear up without treatment. However, all women with symptoms of BV should be treated to avoid complications, especially if gynecology surgery is planned. Female partners generally do not need to be treated. However,  BV may spread between female sex partners so treatment is helpful in preventing a recurrence of BV.   BV may be treated with antibiotics. The antibiotics come in either pill or vaginal cream forms. Either can be used with nonpregnant or pregnant women, but the recommended dosages differ. These antibiotics are not harmful to the baby.   BV can recur after treatment. If this happens, a second round of antibiotics will often be prescribed.   Treatment is important for pregnant women. If not treated, BV can cause a premature delivery, especially for a pregnant woman who had a premature birth in the past. All pregnant women who have symptoms of BV should be checked and treated.   For chronic reoccurrence of BV, treatment with a type of prescribed gel vaginally twice a week is helpful.  HOME CARE INSTRUCTIONS   Finish all medication as directed by your caregiver.   Do not have sex until treatment is completed.   Tell your sexual partner that you have a vaginal infection. They should see their caregiver and be treated if they have problems, such as a mild rash or itching.   Practice safe sex. Use condoms. Only have 1 sex partner.  PREVENTION  Basic prevention steps can help reduce the risk of upsetting the natural balance of bacteria in the vagina and developing BV:  Do not have sexual intercourse (be abstinent).   Do not douche.   Use all of the medicine prescribed for treatment of BV, even if the signs and symptoms go away.     Tell your sex partner if you have BV. That way, they can be treated, if needed, to prevent reoccurrence.  SEEK MEDICAL CARE IF:   Your symptoms are not improving after 3 days of treatment.   You have increased discharge, pain, or fever.  MAKE SURE YOU:   Understand these instructions.   Will watch your condition.   Will get help right away if you are not doing well or get worse.  FOR MORE INFORMATION  Division of STD Prevention (DSTDP), Centers for Disease  Control and Prevention: www.cdc.gov/std American Social Health Association (ASHA): www.ashastd.org  Document Released: 06/16/2005 Document Revised: 02/26/2011 Document Reviewed: 12/07/2008 [employer] Patient Information 2012 [employer]. 

## 2011-09-05 ENCOUNTER — Encounter (HOSPITAL_COMMUNITY): Payer: Self-pay

## 2011-09-05 ENCOUNTER — Inpatient Hospital Stay (HOSPITAL_COMMUNITY): Payer: Medicaid Other

## 2011-09-05 ENCOUNTER — Inpatient Hospital Stay (HOSPITAL_COMMUNITY)
Admission: AD | Admit: 2011-09-05 | Discharge: 2011-09-05 | Disposition: A | Discharge status: Home / Self Care | Payer: Medicaid Other | Source: Ambulatory Visit | Attending: Obstetrics & Gynecology | Admitting: Obstetrics & Gynecology

## 2011-09-05 DIAGNOSIS — B9689 Other specified bacterial agents as the cause of diseases classified elsewhere: Secondary | ICD-10-CM

## 2011-09-05 DIAGNOSIS — R109 Unspecified abdominal pain: Secondary | ICD-10-CM | POA: Insufficient documentation

## 2011-09-05 DIAGNOSIS — O209 Hemorrhage in early pregnancy, unspecified: Secondary | ICD-10-CM | POA: Insufficient documentation

## 2011-09-05 DIAGNOSIS — Z3201 Encounter for pregnancy test, result positive: Secondary | ICD-10-CM

## 2011-09-05 LAB — CBC
Hemoglobin: 11.8 g/dL — ABNORMAL LOW (ref 12.0–15.0)
MCH: 25.8 pg — ABNORMAL LOW (ref 26.0–34.0)
MCV: 79.2 fL (ref 78.0–100.0)
RBC: 4.57 MIL/uL (ref 3.87–5.11)
WBC: 6.3 10*3/uL (ref 4.0–10.5)

## 2011-09-05 LAB — URINALYSIS, ROUTINE W REFLEX MICROSCOPIC
Bilirubin Urine: NEGATIVE
Hgb urine dipstick: NEGATIVE
Protein, ur: NEGATIVE mg/dL
Urobilinogen, UA: 0.2 mg/dL (ref 0.0–1.0)

## 2011-09-05 LAB — WET PREP, GENITAL: Yeast Wet Prep HPF POC: NONE SEEN

## 2011-09-05 MED ORDER — METRONIDAZOLE 500 MG PO TABS: 500.0000 mg | ORAL_TABLET | Freq: Two times a day (BID) | ORAL | Status: AC

## 2011-09-05 NOTE — ED Provider Notes (Signed)
History     Chief Complaint  Patient presents with  . Possible Pregnancy  . Abdominal Pain   HPI Patient is a 26 yo G3P2002 presenting for abdominal pain and vaginal spotting. Patient states she was seen in [facility] ED a few weeks ago. She had a neg UPT at that time, but states she has taken home tests which were positive. Bleeding started one week ago off and on. States it was light pink mucous like discharge. Abd pain started at same time. Felt like cramp and pressure. LMP 07/13/2011. Patient was treated for BV 2 weeks ago. Finished 7 day course of Flagyl. Continues to have discharge.   Last prenatal care was at [facility]; last delivery was in August. Last pregnancy complicated by GDM. Took Glyburide throughout pregnancy. Not currently on any DM medications.  OB History    Grav Para Term Preterm Abortions TAB SAB Ect Mult Living   4 3 3       3       Past Medical History  Diagnosis Date  . Asthma   . Anxiety   . Depression   . ADHD (attention deficit hyperactivity disorder)   . ADHD (attention deficit hyperactivity disorder)   . PTSD (post-traumatic stress disorder)   . Gestational diabetes     Past Surgical History  Procedure Date  . Cesarean section   . Tonsillectomy   . Addnoidectomy   . Cesarean section 02/10/2011    Procedure: CESAREAN SECTION;  Surgeon: Esmeralda Arthur, MD;  Location: [facility] ORS;  Service: Gynecology;  Laterality: N/A;  Repeat    No family history on file.  History  Substance Use Topics  . Smoking status: Current Everyday Smoker -- 0.2 packs/day    Types: Cigarettes  . Smokeless tobacco: Not on file  . Alcohol Use: Yes    Allergies: No Known Allergies  Prescriptions prior to admission  Medication Sig Dispense Refill  . albuterol (PROVENTIL HFA;VENTOLIN HFA) 108 (90 BASE) MCG/ACT inhaler Inhale 1 puff into the lungs every 4 (four) hours as needed. For asthma      . ARIPiprazole (ABILIFY) 2 MG tablet Take 2 mg by mouth daily.        Marland Kitchen FLUoxetine (PROZAC)  20 MG tablet Take 20 mg by mouth daily.          ROS Negative except as noted in HPI.  Physical Exam   Blood pressure 126/65, pulse 78, temperature 99 F (37.2 C), temperature source Oral, resp. rate 20, height 5\' 6"  (1.676 m), weight 111.494 kg (245 lb 12.8 oz), last menstrual period 07/13/2011, SpO2 100.00%.  Physical Exam  Constitutional: She is oriented to person, place, and time. She appears well-developed and well-nourished. No distress.  HENT:  Head: Normocephalic and atraumatic.  Neck: Normal range of motion.  Cardiovascular: Normal rate and regular rhythm.   No murmur heard. Respiratory: Effort normal and breath sounds normal. She has no wheezes.  GI: Soft. There is no tenderness.  Musculoskeletal: Normal range of motion. She exhibits no edema and no tenderness.  Neurological: She is alert and oriented to person, place, and time. No cranial nerve deficit.  Skin: Skin is dry. No rash noted.    MAU Course  Procedures Results for orders placed during the hospital encounter of 09/05/11 (from the past 24 hour(s))  URINALYSIS, ROUTINE W REFLEX MICROSCOPIC     Status: Abnormal   Collection Time   09/05/11  4:02 PM      Component Value Range   Color,  Urine YELLOW  YELLOW    APPearance CLEAR  CLEAR    Specific Gravity, Urine 1.020  1.005 - 1.030    pH 6.5  5.0 - 8.0    Glucose, UA 250 (*) NEGATIVE (mg/dL)   Hgb urine dipstick NEGATIVE  NEGATIVE    Bilirubin Urine NEGATIVE  NEGATIVE    Ketones, ur NEGATIVE  NEGATIVE (mg/dL)   Protein, ur NEGATIVE  NEGATIVE (mg/dL)   Urobilinogen, UA 0.2  0.0 - 1.0 (mg/dL)   Nitrite NEGATIVE  NEGATIVE    Leukocytes, UA NEGATIVE  NEGATIVE   POCT PREGNANCY, URINE     Status: Abnormal   Collection Time   09/05/11  4:15 PM      Component Value Range   Preg Test, Ur POSITIVE (*) NEGATIVE   HCG, QUANTITATIVE, PREGNANCY     Status: Abnormal   Collection Time   09/05/11  5:13 PM      Component Value Range   hCG, Beta Chain, Quant, [place] [address] (*) <5  (mIU/mL)  ABO/RH     Status: Normal   Collection Time   09/05/11  5:13 PM      Component Value Range   ABO/RH(D) B POS    CBC     Status: Abnormal   Collection Time   09/05/11  5:13 PM      Component Value Range   WBC 6.3  4.0 - 10.5 (K/uL)   RBC 4.57  3.87 - 5.11 (MIL/uL)   Hemoglobin 11.8 (*) 12.0 - 15.0 (g/dL)   HCT 91.4  78.2 - 95.6 (%)   MCV 79.2  78.0 - 100.0 (fL)   MCH 25.8 (*) 26.0 - 34.0 (pg)   MCHC 32.6  30.0 - 36.0 (g/dL)   RDW 21.3 (*) 08.6 - 15.5 (%)   Platelets 246  150 - 400 (K/uL)  US Ob Transvaginal  09/05/2011  *RADIOLOGY REPORT*  Clinical Data: 26 year old pregnant female with right pelvic pain and spotting.  Estimated gestational age of [redacted] weeks 0 days by LMP.  TRANSVAGINAL OBSTETRIC US  Technique:  Transvaginal ultrasound was performed for complete evaluation of the gestation as well as the maternal uterus, adnexal regions, and pelvic cul-de-sac.  Comparison:  None  Intrauterine gestational sac: Present Yolk sac: Present Embryo: Present Cardiac Activity: Present Heart Rate: 132  CRL: 9.1 mm           7   w  0   d        [place] EDC: 04/23/2012  Subchorionic hemorrhage: Small  Maternal uterus/adnexae: The ovaries bilaterally are unremarkable. There is no evidence of solid adnexal mass. A trace amount of free fluid is present.  IMPRESSION: Single living intrauterine gestation with estimated gestational age of [redacted] weeks 0 days by this ultrasound.  Small subchorionic hemorrhage.  Original Report Authenticated By: Rosendo Gros, M.D.   MDM Patient had positive urine preg test. Since she reports spotting, early pregnancy bleeding work up was done including CBC, ABO, BHCG quant and transvaginal ultrasound.   Assessment and Plan  26 yo F G4P2002 at [redacted]w[redacted]d by [place] today presenting for abdominal pain and vaginal bleeding - UPT positive. US shows single living intrauterine gestation and small subchorionic hemorrhage - GC/Ch and wet prep done today - Patient will need to establish prenatal care,  which she states she will be going to [facility] - Patient also had glucose in her urine. She states she has a strong family Hx of DM, and she had GDM during last pregnancy.  CBG not done today because she was eating potato chips in exam room. Encouraged her to follow up with OB as soon as she can get an appointment. HgB A1C done today. - Patient discharged home in stable medical condition with bleeding precautions. - Plan discussed with Sid Falcon, CNM who agrees  [facility], Jordanne Elsbury 09/05/2011, 6:04 PM

## 2011-09-05 NOTE — Progress Notes (Signed)
Patient states she has had 5 positive home pregnancy tests. Had a negative at [facility] ED about 2 weeks ago. Started having abdominal pain and spotting about one week ago.

## 2011-09-05 NOTE — ED Notes (Signed)
Patient not in lobby when called

## 2011-09-05 NOTE — Discharge Instructions (Signed)
Please make an appointment as soon as possible to be seen. We are checking your Hemoglobin A1C today which will tell us how your blood sugar has been in the last 3 months. It will be important to have your blood sugar checked soon. Also, please start taking your prenatal vitamin. Your infection has not resolved either. Please take another 7 days of treatment. If you have increased bleeding, sharp pains, or any changes, please do not hesitate to be seen again.  Bacterial Vaginosis Bacterial vaginosis (BV) is a vaginal infection where the normal balance of bacteria in the vagina is disrupted. The normal balance is then replaced by an overgrowth of certain bacteria. There are several different kinds of bacteria that can cause BV. BV is the most common vaginal infection in women of childbearing age. CAUSES   The cause of BV is not fully understood. BV develops when there is an increase or imbalance of harmful bacteria.   Some activities or behaviors can upset the normal balance of bacteria in the vagina and put women at increased risk including:   Having a new sex partner or multiple sex partners.   Douching.   Using an intrauterine device (IUD) for contraception.   It is not clear what role sexual activity plays in the development of BV. However, women that have never had sexual intercourse are rarely infected with BV.  Women do not get BV from toilet seats, bedding, swimming pools or from touching objects around them.  SYMPTOMS   Grey vaginal discharge.   A fish-like odor with discharge, especially after sexual intercourse.   Itching or burning of the vagina and vulva.   Burning or pain with urination.   Some women have no signs or symptoms at all.  DIAGNOSIS  Your caregiver must examine the vagina for signs of BV. Your caregiver will perform lab tests and look at the sample of vaginal fluid through a microscope. They will look for bacteria and abnormal cells (clue cells), a pH test  higher than 4.5, and a positive amine test all associated with BV.  RISKS AND COMPLICATIONS   Pelvic inflammatory disease (PID).   Infections following gynecology surgery.   Developing HIV.   Developing herpes virus.  TREATMENT  Sometimes BV will clear up without treatment. However, all women with symptoms of BV should be treated to avoid complications, especially if gynecology surgery is planned. Female partners generally do not need to be treated. However, BV may spread between female sex partners so treatment is helpful in preventing a recurrence of BV.   BV may be treated with antibiotics. The antibiotics come in either pill or vaginal cream forms. Either can be used with nonpregnant or pregnant women, but the recommended dosages differ. These antibiotics are not harmful to the baby.   BV can recur after treatment. If this happens, a second round of antibiotics will often be prescribed.   Treatment is important for pregnant women. If not treated, BV can cause a premature delivery, especially for a pregnant woman who had a premature birth in the past. All pregnant women who have symptoms of BV should be checked and treated.   For chronic reoccurrence of BV, treatment with a type of prescribed gel vaginally twice a week is helpful.  HOME CARE INSTRUCTIONS   Finish all medication as directed by your caregiver.   Do not have sex until treatment is completed.   Tell your sexual partner that you have a vaginal infection. They should see their caregiver  and be treated if they have problems, such as a mild rash or itching.   Practice safe sex. Use condoms. Only have 1 sex partner.  PREVENTION  Basic prevention steps can help reduce the risk of upsetting the natural balance of bacteria in the vagina and developing BV:  Do not have sexual intercourse (be abstinent).   Do not douche.   Use all of the medicine prescribed for treatment of BV, even if the signs and symptoms go away.   Tell  your sex partner if you have BV. That way, they can be treated, if needed, to prevent reoccurrence.  SEEK MEDICAL CARE IF:   Your symptoms are not improving after 3 days of treatment.   You have increased discharge, pain, or fever.  MAKE SURE YOU:   Understand these instructions.   Will watch your condition.   Will get help right away if you are not doing well or get worse.  FOR MORE INFORMATION  Division of STD Prevention (DSTDP), Centers for Disease Control and Prevention: [employer] American Social Health Association (ASHA): www.ashastd.org  Document Released: 06/16/2005 Document Revised: 06/05/2011 Document Reviewed: 12/07/2008 [facility] Patient Information 2012 [place], [place].

## 2011-09-06 LAB — HEMOGLOBIN A1C
Hgb A1c MFr Bld: 6.5 % — ABNORMAL HIGH (ref <5.7)
Mean Plasma Glucose: 140 mg/dL — ABNORMAL HIGH (ref <117)

## 2011-09-08 LAB — GC/CHLAMYDIA PROBE AMP, GENITAL: GC Probe Amp, Genital: POSITIVE — AB

## 2011-09-23 NOTE — ED Provider Notes (Signed)
Attestation of Attending Supervision of Advanced Practitioner: Evaluation and management procedures were performed by the PA/NP/CNM/OB Fellow under my supervision/collaboration. Chart reviewed and agree with management and plan.  Zaidy Absher V 09/23/2011 1:07 PM

## 2011-10-01 ENCOUNTER — Encounter (HOSPITAL_COMMUNITY): Payer: Self-pay | Admitting: *Deleted

## 2011-11-12 ENCOUNTER — Telehealth: Payer: Self-pay | Admitting: Obstetrics and Gynecology

## 2011-11-12 NOTE — Telephone Encounter (Signed)
Triage only per pt

## 2011-11-26 ENCOUNTER — Encounter: Payer: Self-pay | Admitting: Obstetrics and Gynecology

## 2011-11-26 ENCOUNTER — Ambulatory Visit (INDEPENDENT_AMBULATORY_CARE_PROVIDER_SITE_OTHER): Payer: Medicaid Other | Admitting: Obstetrics and Gynecology

## 2011-11-26 VITALS — BP 110/82 | Wt 235.0 lb

## 2011-11-26 DIAGNOSIS — E119 Type 2 diabetes mellitus without complications: Secondary | ICD-10-CM

## 2011-11-26 DIAGNOSIS — Z733 Stress, not elsewhere classified: Secondary | ICD-10-CM

## 2011-11-26 DIAGNOSIS — Z124 Encounter for screening for malignant neoplasm of cervix: Secondary | ICD-10-CM

## 2011-11-26 DIAGNOSIS — O3421 Maternal care for scar from previous cesarean delivery: Secondary | ICD-10-CM

## 2011-11-26 DIAGNOSIS — O09899 Supervision of other high risk pregnancies, unspecified trimester: Secondary | ICD-10-CM

## 2011-11-26 DIAGNOSIS — Z658 Other specified problems related to psychosocial circumstances: Secondary | ICD-10-CM

## 2011-11-26 DIAGNOSIS — N760 Acute vaginitis: Secondary | ICD-10-CM

## 2011-11-26 DIAGNOSIS — B9689 Other specified bacterial agents as the cause of diseases classified elsewhere: Secondary | ICD-10-CM | POA: Insufficient documentation

## 2011-11-26 DIAGNOSIS — Z331 Pregnant state, incidental: Secondary | ICD-10-CM

## 2011-11-26 DIAGNOSIS — O34219 Maternal care for unspecified type scar from previous cesarean delivery: Secondary | ICD-10-CM

## 2011-11-26 DIAGNOSIS — O98311 Other infections with a predominantly sexual mode of transmission complicating pregnancy, first trimester: Secondary | ICD-10-CM

## 2011-11-26 DIAGNOSIS — A499 Bacterial infection, unspecified: Secondary | ICD-10-CM

## 2011-11-26 LAB — POCT WET PREP (WET MOUNT)

## 2011-11-26 LAB — POCT URINALYSIS DIPSTICK
Bilirubin, UA: NEGATIVE
Blood, UA: NEGATIVE
Glucose, UA: 1000
Nitrite, UA: POSITIVE
Spec Grav, UA: >=1.03
Urobilinogen, UA: NEGATIVE
pH, UA: 5

## 2011-11-26 MED ORDER — IBUPROFEN 600 MG PO TABS: 600.0000 mg | ORAL_TABLET | Freq: Four times a day (QID) | ORAL | Status: AC | PRN

## 2011-11-26 MED ORDER — METRONIDAZOLE 500 MG PO TABS: 500.0000 mg | ORAL_TABLET | Freq: Two times a day (BID) | ORAL | Status: AC

## 2011-11-26 NOTE — Assessment & Plan Note (Signed)
GC diagnosed at [facility] in March. TOC at [facility]

## 2011-11-26 NOTE — Progress Notes (Signed)
Agrees to genetic screenings Pap due  Pt states she is very stressed and not happy about this pregnancy
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
## 2011-11-26 NOTE — Assessment & Plan Note (Signed)
C/S x 2, with last one 8/12.
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
## 2011-11-27 ENCOUNTER — Other Ambulatory Visit: Payer: Self-pay | Admitting: Obstetrics and Gynecology

## 2011-11-27 DIAGNOSIS — Z1389 Encounter for screening for other disorder: Secondary | ICD-10-CM

## 2011-11-27 LAB — HEMOGLOBIN A1C: Mean Plasma Glucose: 117 mg/dL — ABNORMAL HIGH (ref <117)

## 2011-11-27 NOTE — Progress Notes (Signed)
Will do FBS and 2 hour pcs--has meter.  Rx'd test strips and lancets--patient to take her meter to pharmacy to get right strips, etc.

## 2011-11-28 ENCOUNTER — Ambulatory Visit (INDEPENDENT_AMBULATORY_CARE_PROVIDER_SITE_OTHER): Payer: Medicaid Other

## 2011-11-28 ENCOUNTER — Encounter: Payer: Self-pay | Admitting: Obstetrics and Gynecology

## 2011-11-28 ENCOUNTER — Ambulatory Visit (INDEPENDENT_AMBULATORY_CARE_PROVIDER_SITE_OTHER): Payer: Medicaid Other | Admitting: Obstetrics and Gynecology

## 2011-11-28 VITALS — BP 110/64 | Wt 235.0 lb

## 2011-11-28 DIAGNOSIS — O24919 Unspecified diabetes mellitus in pregnancy, unspecified trimester: Secondary | ICD-10-CM

## 2011-11-28 DIAGNOSIS — Z1389 Encounter for screening for other disorder: Secondary | ICD-10-CM

## 2011-11-28 DIAGNOSIS — O99810 Abnormal glucose complicating pregnancy: Secondary | ICD-10-CM

## 2011-11-28 DIAGNOSIS — H539 Unspecified visual disturbance: Secondary | ICD-10-CM | POA: Insufficient documentation

## 2011-11-28 DIAGNOSIS — O24419 Gestational diabetes mellitus in pregnancy, unspecified control: Secondary | ICD-10-CM

## 2011-11-28 LAB — US OB COMP + 14 WK

## 2011-11-28 LAB — AFP, QUAD SCREEN
AFP: 76.7 IU/mL
Curr Gest Age: 19.3 wks.days
Interpretation-AFP: NEGATIVE
Open Spina bifida: NEGATIVE
Osb Risk: 1:1100 {titer}
uE3 Mom: 0.75
uE3 Value: 0.8 ng/mL

## 2011-11-28 NOTE — Progress Notes (Signed)
Pt without c/o [place] for anatomy s=d, cx 4.08 cm, vtx presentation with normal fluid, all heart views not seen.  F/u anatomy in 4 weeks Type 2 dm pt did not bring bs.  To [facility], schedule fetal echo and bring bs in one week Pt states she occ sees spots and feels paralyzed.  She is concerned she is having szs.  Refer to neurology

## 2011-11-28 NOTE — Progress Notes (Signed)
Pt unable to urinate will leave sample before she leaves 

## 2011-11-29 ENCOUNTER — Encounter: Payer: Self-pay | Admitting: Obstetrics and Gynecology

## 2011-11-29 DIAGNOSIS — N390 Urinary tract infection, site not specified: Secondary | ICD-10-CM | POA: Insufficient documentation

## 2011-11-29 DIAGNOSIS — B9620 Unspecified Escherichia coli [E. coli] as the cause of diseases classified elsewhere: Secondary | ICD-10-CM

## 2011-11-29 LAB — URINE CULTURE

## 2011-11-30 LAB — PAP IG, CT-NG, RFX HPV ASCU: Chlamydia Probe Amp: NEGATIVE

## 2011-12-01 ENCOUNTER — Telehealth: Payer: Self-pay

## 2011-12-01 ENCOUNTER — Encounter: Payer: Self-pay | Admitting: Obstetrics and Gynecology

## 2011-12-01 ENCOUNTER — Other Ambulatory Visit: Payer: Self-pay

## 2011-12-01 DIAGNOSIS — N390 Urinary tract infection, site not specified: Secondary | ICD-10-CM

## 2011-12-01 DIAGNOSIS — R569 Unspecified convulsions: Secondary | ICD-10-CM

## 2011-12-01 DIAGNOSIS — E1165 Type 2 diabetes mellitus with hyperglycemia: Secondary | ICD-10-CM

## 2011-12-01 MED ORDER — NITROFURANTOIN MONOHYD MACRO 100 MG PO CAPS
100.0000 mg | ORAL_CAPSULE | Freq: Two times a day (BID) | ORAL | Status: AC
Start: 2011-12-01 — End: 2011-12-08

## 2011-12-01 NOTE — Progress Notes (Signed)
A user error has taken place: encounter opened in error, closed for administrative reasons.

## 2011-12-01 NOTE — Telephone Encounter (Signed)
Spoke with pt informing her UA results abnormal. Macrobid 100 mg bid x 7 days sent to [facility] [address]. Informed pt TOC will be needed during 12/11/11 appt with Dr. AVS and to do a clean catch urine sample in the green top cup. Pt agrees and understands.

## 2011-12-01 NOTE — Telephone Encounter (Signed)
Spoke with pt rgd referrals informed pt appt for Dr. Mitzie Na for diabetes control 12/09/11 at 3:00 number and address given, appt to [facility] specialty for fetal echo 12/04/11 at 9:00 number an address given pt has appt here at our office f/u CBGs and ref to neurologist they will call with appt also refer to [facility] [place] they will call with appt pt voice understanding

## 2011-12-01 NOTE — Telephone Encounter (Signed)
Message copied by Janeece Agee on Mon Dec 01, 2011 10:42 AM ------      Message from: Cornelius Moras      Created: Sat Nov 29, 2011  3:37 PM      Regarding: Needs macrobid       Urine culture resulted from [facility] shows Ecoli--sensitive to [employer].      Couldn't get patient this weekend.      Please Rx Macrobid bid x 7 days, no refills.      Check TOC at [facility].            ----- Message -----         From: Chaney Malling, CMA         Sent: 11/26/2011   5:24 PM           To: Nigel Bridgeman, CNM

## 2011-12-04 ENCOUNTER — Encounter: Payer: Self-pay | Admitting: Obstetrics and Gynecology

## 2011-12-05 ENCOUNTER — Ambulatory Visit (INDEPENDENT_AMBULATORY_CARE_PROVIDER_SITE_OTHER): Payer: Medicaid Other | Admitting: Obstetrics and Gynecology

## 2011-12-05 VITALS — BP 118/72 | Wt 231.0 lb

## 2011-12-05 DIAGNOSIS — O99810 Abnormal glucose complicating pregnancy: Secondary | ICD-10-CM

## 2011-12-05 DIAGNOSIS — O24419 Gestational diabetes mellitus in pregnancy, unspecified control: Secondary | ICD-10-CM

## 2011-12-05 NOTE — Progress Notes (Signed)
Pt has no complaints/ JM  

## 2011-12-05 NOTE — Progress Notes (Signed)
Fasting blood sugars between 75 and 91.  Two-hour p.c. Blood sugars are high (greater than 25%).  Begin glyburide 5 mg each day. Return office in one week. The schedule appointment with [facility] 4 fetal echo. Neurology appointment has been scheduled.

## 2011-12-11 ENCOUNTER — Ambulatory Visit (INDEPENDENT_AMBULATORY_CARE_PROVIDER_SITE_OTHER): Payer: Medicaid Other | Admitting: Obstetrics and Gynecology

## 2011-12-11 VITALS — BP 118/74 | Wt 230.0 lb

## 2011-12-11 DIAGNOSIS — N390 Urinary tract infection, site not specified: Secondary | ICD-10-CM

## 2011-12-11 DIAGNOSIS — O99810 Abnormal glucose complicating pregnancy: Secondary | ICD-10-CM

## 2011-12-11 DIAGNOSIS — Z202 Contact with and (suspected) exposure to infections with a predominantly sexual mode of transmission: Secondary | ICD-10-CM

## 2011-12-11 DIAGNOSIS — Z5181 Encounter for therapeutic drug level monitoring: Secondary | ICD-10-CM

## 2011-12-11 DIAGNOSIS — O24419 Gestational diabetes mellitus in pregnancy, unspecified control: Secondary | ICD-10-CM

## 2011-12-11 DIAGNOSIS — Z2089 Contact with and (suspected) exposure to other communicable diseases: Secondary | ICD-10-CM

## 2011-12-11 NOTE — Progress Notes (Signed)
The patient says her blood sugars have been normal and she has not started glyburide.  She did not bring her log. Return office in 2 weeks.  Patient was encouraged to bring in her sugars next visit. Dr. Stefano Gaul

## 2011-12-11 NOTE — Progress Notes (Signed)
Pt did not bring cbg record dextrostix 84

## 2011-12-26 ENCOUNTER — Encounter: Payer: Medicaid Other | Admitting: Obstetrics and Gynecology

## 2011-12-26 ENCOUNTER — Other Ambulatory Visit: Payer: Medicaid Other

## 2011-12-30 ENCOUNTER — Other Ambulatory Visit: Payer: Self-pay | Admitting: Obstetrics and Gynecology

## 2011-12-30 ENCOUNTER — Ambulatory Visit (INDEPENDENT_AMBULATORY_CARE_PROVIDER_SITE_OTHER): Payer: Medicaid Other | Admitting: Obstetrics and Gynecology

## 2011-12-30 ENCOUNTER — Ambulatory Visit (INDEPENDENT_AMBULATORY_CARE_PROVIDER_SITE_OTHER): Payer: Medicaid Other

## 2011-12-30 VITALS — BP 118/62 | Wt 227.0 lb

## 2011-12-30 DIAGNOSIS — Z331 Pregnant state, incidental: Secondary | ICD-10-CM

## 2011-12-30 DIAGNOSIS — O24919 Unspecified diabetes mellitus in pregnancy, unspecified trimester: Secondary | ICD-10-CM

## 2011-12-30 DIAGNOSIS — Z3490 Encounter for supervision of normal pregnancy, unspecified, unspecified trimester: Secondary | ICD-10-CM

## 2011-12-30 NOTE — Progress Notes (Signed)
Patient had return visit for f/u USS  As spine had not been completely viewed on USS last week aand also to check growth USS: Spine viewed - normal Vx Presentation, Fundal placenta, normal growth 53th% ROB x 4 week - to have 1Hr Gtt at this visit.

## 2011-12-30 NOTE — Progress Notes (Signed)
Pt states she has no concerns today.  

## 2011-12-31 ENCOUNTER — Ambulatory Visit: Payer: Medicaid Other | Admitting: *Deleted

## 2011-12-31 LAB — US OB FOLLOW UP

## 2012-01-02 ENCOUNTER — Encounter: Payer: Medicaid Other | Admitting: Obstetrics and Gynecology

## 2012-01-05 ENCOUNTER — Encounter: Payer: Medicaid Other | Admitting: Obstetrics and Gynecology

## 2012-01-20 ENCOUNTER — Telehealth: Payer: Self-pay | Admitting: Obstetrics and Gynecology

## 2012-01-20 NOTE — Telephone Encounter (Signed)
Spoke with pt rgd msg pt want note stating she's unable to work due to pregnancy advised pt she can discuss with provider at next visit pt voice understanding

## 2012-01-20 NOTE — Telephone Encounter (Signed)
Triage/epic 

## 2012-01-27 ENCOUNTER — Ambulatory Visit (INDEPENDENT_AMBULATORY_CARE_PROVIDER_SITE_OTHER): Payer: Medicaid Other | Admitting: Obstetrics and Gynecology

## 2012-01-27 ENCOUNTER — Other Ambulatory Visit: Payer: Medicaid Other

## 2012-01-29 ENCOUNTER — Encounter: Payer: Self-pay | Admitting: Obstetrics and Gynecology

## 2012-01-29 ENCOUNTER — Ambulatory Visit (INDEPENDENT_AMBULATORY_CARE_PROVIDER_SITE_OTHER): Payer: Medicaid Other | Admitting: Obstetrics and Gynecology

## 2012-01-29 VITALS — BP 112/68 | Wt 224.0 lb

## 2012-01-29 DIAGNOSIS — R5383 Other fatigue: Secondary | ICD-10-CM

## 2012-01-29 DIAGNOSIS — R81 Glycosuria: Secondary | ICD-10-CM

## 2012-01-29 LAB — GLUCOSE, POCT (MANUAL RESULT ENTRY): POC Glucose: 139 mg/dl — AB (ref 70–99)

## 2012-01-29 LAB — CBC
Platelets: 227 10*3/uL (ref 150–400)
RDW: 14.5 % (ref 11.5–15.5)
WBC: 7.8 10*3/uL (ref 4.0–10.5)

## 2012-01-29 NOTE — Progress Notes (Signed)
139 blood sugar 1:00 chicken, hash brown

## 2012-01-29 NOTE — Progress Notes (Signed)
[redacted]w[redacted]d To reschedule 1hr GTT as had just eaten. CBC, CMP today. As patient had been feeling very fatigued and anxious about this feeling. No change in vaginal secretions. ROB for rescheduled 1hr GTT. ROB 2 weeks

## 2012-01-30 ENCOUNTER — Other Ambulatory Visit: Payer: Medicaid Other

## 2012-01-30 DIAGNOSIS — Z3490 Encounter for supervision of normal pregnancy, unspecified, unspecified trimester: Secondary | ICD-10-CM

## 2012-01-30 LAB — COMPREHENSIVE METABOLIC PANEL
ALT: 8 U/L (ref 0–35)
AST: 15 U/L (ref 0–37)
Calcium: 9.1 mg/dL (ref 8.4–10.5)
Chloride: 104 mEq/L (ref 96–112)
Creat: 0.8 mg/dL (ref 0.50–1.10)
Potassium: 4 mEq/L (ref 3.5–5.3)

## 2012-01-30 LAB — HEMOGLOBIN A1C: Mean Plasma Glucose: 114 mg/dL (ref <117)

## 2012-02-02 ENCOUNTER — Telehealth: Payer: Self-pay | Admitting: Obstetrics and Gynecology

## 2012-02-04 ENCOUNTER — Telehealth: Payer: Self-pay | Admitting: Obstetrics and Gynecology

## 2012-02-04 NOTE — Telephone Encounter (Signed)
Called pt to sch. 3 hr GTT pt's phone not excepting mess.  Called contact #(mother) also left mess. For pt to call office back.

## 2012-02-05 ENCOUNTER — Telehealth: Payer: Self-pay | Admitting: Obstetrics and Gynecology

## 2012-02-05 NOTE — Telephone Encounter (Signed)
LM for pt to call office pt needs 3 hr GTT also left mess at mothers number.  Will mail letter to pt to call office.

## 2012-02-10 ENCOUNTER — Other Ambulatory Visit: Payer: Self-pay | Admitting: Obstetrics and Gynecology

## 2012-02-10 ENCOUNTER — Telehealth: Payer: Self-pay | Admitting: Obstetrics and Gynecology

## 2012-02-10 DIAGNOSIS — O99810 Abnormal glucose complicating pregnancy: Secondary | ICD-10-CM

## 2012-02-10 NOTE — Telephone Encounter (Signed)
Pt called office.  Pt has been unable to reach. Sch pt for 1 hr GTT on 02-16-2012.  Pt will pick up diet and appt info at appt on 02-12-2012

## 2012-02-12 ENCOUNTER — Encounter: Payer: Medicaid Other | Admitting: Obstetrics and Gynecology

## 2012-02-13 ENCOUNTER — Ambulatory Visit (INDEPENDENT_AMBULATORY_CARE_PROVIDER_SITE_OTHER): Payer: Medicaid Other | Admitting: Obstetrics and Gynecology

## 2012-02-13 ENCOUNTER — Encounter: Payer: Self-pay | Admitting: Obstetrics and Gynecology

## 2012-02-13 VITALS — BP 110/56 | Wt 224.0 lb

## 2012-02-13 DIAGNOSIS — O24419 Gestational diabetes mellitus in pregnancy, unspecified control: Secondary | ICD-10-CM

## 2012-02-13 DIAGNOSIS — O99810 Abnormal glucose complicating pregnancy: Secondary | ICD-10-CM

## 2012-02-13 NOTE — Patient Instructions (Signed)
Fetal Movement Counts Patient Name: __________________________________________________ Patient Due Date: ____________________ Kick counts is highly recommended in high risk pregnancies, but it is a good idea for every pregnant woman to do. Start counting fetal movements at 28 weeks of the pregnancy. Fetal movements increase after eating a full meal or eating or drinking something sweet (the blood sugar is higher). It is also important to drink plenty of fluids (well hydrated) before doing the count. Lie on your left side because it helps with the circulation or you can sit in a comfortable chair with your arms over your belly (abdomen) with no distractions around you. DOING THE COUNT  Try to do the count the same time of day each time you do it.   Mark the day and time, then see how long it takes for you to feel 10 movements (kicks, flutters, swishes, rolls). You should have at least 10 movements within 2 hours. You will most likely feel 10 movements in much less than 2 hours. If you do not, wait an hour and count again. After a couple of days you will see a pattern.   What you are looking for is a change in the pattern or not enough counts in 2 hours. Is it taking longer in time to reach 10 movements?  SEEK MEDICAL CARE IF:  You feel less than 10 counts in 2 hours. Tried twice.   No movement in one hour.   The pattern is changing or taking longer each day to reach 10 counts in 2 hours.   You feel the baby is not moving as it usually does.  Date: ____________ Movements: ____________ Start time: ____________ Finish time: ____________  Date: ____________ Movements: ____________ Start time: ____________ Finish time: ____________ Date: ____________ Movements: ____________ Start time: ____________ Finish time: ____________ Date: ____________ Movements: ____________ Start time: ____________ Finish time: ____________ Date: ____________ Movements: ____________ Start time: ____________ Finish time:  ____________ Date: ____________ Movements: ____________ Start time: ____________ Finish time: ____________ Date: ____________ Movements: ____________ Start time: ____________ Finish time: ____________ Date: ____________ Movements: ____________ Start time: ____________ Finish time: ____________  Date: ____________ Movements: ____________ Start time: ____________ Finish time: ____________ Date: ____________ Movements: ____________ Start time: ____________ Finish time: ____________ Date: ____________ Movements: ____________ Start time: ____________ Finish time: ____________ Date: ____________ Movements: ____________ Start time: ____________ Finish time: ____________ Date: ____________ Movements: ____________ Start time: ____________ Finish time: ____________ Date: ____________ Movements: ____________ Start time: ____________ Finish time: ____________ Date: ____________ Movements: ____________ Start time: ____________ Finish time: ____________  Date: ____________ Movements: ____________ Start time: ____________ Finish time: ____________ Date: ____________ Movements: ____________ Start time: ____________ Finish time: ____________ Date: ____________ Movements: ____________ Start time: ____________ Finish time: ____________ Date: ____________ Movements: ____________ Start time: ____________ Finish time: ____________ Date: ____________ Movements: ____________ Start time: ____________ Finish time: ____________ Date: ____________ Movements: ____________ Start time: ____________ Finish time: ____________ Date: ____________ Movements: ____________ Start time: ____________ Finish time: ____________  Date: ____________ Movements: ____________ Start time: ____________ Finish time: ____________ Date: ____________ Movements: ____________ Start time: ____________ Finish time: ____________ Date: ____________ Movements: ____________ Start time: ____________ Finish time: ____________ Date: ____________ Movements:  ____________ Start time: ____________ Finish time: ____________ Date: ____________ Movements: ____________ Start time: ____________ Finish time: ____________ Date: ____________ Movements: ____________ Start time: ____________ Finish time: ____________ Date: ____________ Movements: ____________ Start time: ____________ Finish time: ____________  Date: ____________ Movements: ____________ Start time: ____________ Finish time: ____________ Date: ____________ Movements: ____________ Start time: ____________ Finish time: ____________ Date: ____________ Movements: ____________ Start time:   ____________ Finish time: ____________ Date: ____________ Movements: ____________ Start time: ____________ Finish time: ____________ Date: ____________ Movements: ____________ Start time: ____________ Finish time: ____________ Date: ____________ Movements: ____________ Start time: ____________ Finish time: ____________ Date: ____________ Movements: ____________ Start time: ____________ Finish time: ____________  Date: ____________ Movements: ____________ Start time: ____________ Finish time: ____________ Date: ____________ Movements: ____________ Start time: ____________ Finish time: ____________ Date: ____________ Movements: ____________ Start time: ____________ Finish time: ____________ Date: ____________ Movements: ____________ Start time: ____________ Finish time: ____________ Date: ____________ Movements: ____________ Start time: ____________ Finish time: ____________ Date: ____________ Movements: ____________ Start time: ____________ Finish time: ____________ Date: ____________ Movements: ____________ Start time: ____________ Finish time: ____________  Date: ____________ Movements: ____________ Start time: ____________ Finish time: ____________ Date: ____________ Movements: ____________ Start time: ____________ Finish time: ____________ Date: ____________ Movements: ____________ Start time: ____________ Finish  time: ____________ Date: ____________ Movements: ____________ Start time: ____________ Finish time: ____________ Date: ____________ Movements: ____________ Start time: ____________ Finish time: ____________ Date: ____________ Movements: ____________ Start time: ____________ Finish time: ____________ Date: ____________ Movements: ____________ Start time: ____________ Finish time: ____________  Date: ____________ Movements: ____________ Start time: ____________ Finish time: ____________ Date: ____________ Movements: ____________ Start time: ____________ Finish time: ____________ Date: ____________ Movements: ____________ Start time: ____________ Finish time: ____________ Date: ____________ Movements: ____________ Start time: ____________ Finish time: ____________ Date: ____________ Movements: ____________ Start time: ____________ Finish time: ____________ Date: ____________ Movements: ____________ Start time: ____________ Finish time: ____________ Document Released: 07/16/2006 Document Revised: 06/05/2011 Document Reviewed: 01/16/2009 Exit[employer] Patient Information 2012 [employer]. 

## 2012-02-13 NOTE — Progress Notes (Signed)
1. GDM  Pt does not need a three hour gtt.  She needs to bring in her blood sugars from fasting and 2hrpp.  She declined [facility].  [place] @[facility]  for growth 2. Pt stated she had a fetal echo.  Will try to locate the exam 3.  H/O C/S times 3.  Will do fourth cesarean with BTL per pt s request.  She needs to sign the tubal papers today 4.h/o positive gc do toc @[facility]  in one week

## 2012-02-13 NOTE — Progress Notes (Signed)
Pt c/o side pain.  

## 2012-02-17 ENCOUNTER — Ambulatory Visit (INDEPENDENT_AMBULATORY_CARE_PROVIDER_SITE_OTHER): Payer: Medicaid Other | Admitting: Obstetrics and Gynecology

## 2012-02-17 ENCOUNTER — Other Ambulatory Visit: Payer: Self-pay | Admitting: Obstetrics and Gynecology

## 2012-02-17 ENCOUNTER — Encounter: Payer: Self-pay | Admitting: Obstetrics and Gynecology

## 2012-02-17 ENCOUNTER — Ambulatory Visit (INDEPENDENT_AMBULATORY_CARE_PROVIDER_SITE_OTHER): Payer: Medicaid Other

## 2012-02-17 VITALS — BP 120/64 | Wt 227.0 lb

## 2012-02-17 DIAGNOSIS — O24419 Gestational diabetes mellitus in pregnancy, unspecified control: Secondary | ICD-10-CM

## 2012-02-17 DIAGNOSIS — O99810 Abnormal glucose complicating pregnancy: Secondary | ICD-10-CM

## 2012-02-17 DIAGNOSIS — Z2089 Contact with and (suspected) exposure to other communicable diseases: Secondary | ICD-10-CM

## 2012-02-17 DIAGNOSIS — Z202 Contact with and (suspected) exposure to infections with a predominantly sexual mode of transmission: Secondary | ICD-10-CM

## 2012-02-17 MED ORDER — GLYBURIDE 5 MG PO TABS: 5.0000 mg | ORAL_TABLET | Freq: Every day | ORAL | Status: DC

## 2012-02-17 MED ORDER — HYDROCODONE-ACETAMINOPHEN 5-500 MG PO TABS: 1.0000 | ORAL_TABLET | Freq: Four times a day (QID) | ORAL | Status: AC | PRN

## 2012-02-17 NOTE — Patient Instructions (Signed)
Fetal Movement Counts Patient Name: __________________________________________________ Patient Due Date: ____________________ Kick counts is highly recommended in high risk pregnancies, but it is a good idea for every pregnant woman to do. Start counting fetal movements at 28 weeks of the pregnancy. Fetal movements increase after eating a full meal or eating or drinking something sweet (the blood sugar is higher). It is also important to drink plenty of fluids (well hydrated) before doing the count. Lie on your left side because it helps with the circulation or you can sit in a comfortable chair with your arms over your belly (abdomen) with no distractions around you. DOING THE COUNT  Try to do the count the same time of day each time you do it.   Mark the day and time, then see how long it takes for you to feel 10 movements (kicks, flutters, swishes, rolls). You should have at least 10 movements within 2 hours. You will most likely feel 10 movements in much less than 2 hours. If you do not, wait an hour and count again. After a couple of days you will see a pattern.   What you are looking for is a change in the pattern or not enough counts in 2 hours. Is it taking longer in time to reach 10 movements?  SEEK MEDICAL CARE IF:  You feel less than 10 counts in 2 hours. Tried twice.   No movement in one hour.   The pattern is changing or taking longer each day to reach 10 counts in 2 hours.   You feel the baby is not moving as it usually does.  Date: ____________ Movements: ____________ Start time: ____________ Finish time: ____________  Date: ____________ Movements: ____________ Start time: ____________ Finish time: ____________ Date: ____________ Movements: ____________ Start time: ____________ Finish time: ____________ Date: ____________ Movements: ____________ Start time: ____________ Finish time: ____________ Date: ____________ Movements: ____________ Start time: ____________ Finish time:  ____________ Date: ____________ Movements: ____________ Start time: ____________ Finish time: ____________ Date: ____________ Movements: ____________ Start time: ____________ Finish time: ____________ Date: ____________ Movements: ____________ Start time: ____________ Finish time: ____________  Date: ____________ Movements: ____________ Start time: ____________ Finish time: ____________ Date: ____________ Movements: ____________ Start time: ____________ Finish time: ____________ Date: ____________ Movements: ____________ Start time: ____________ Finish time: ____________ Date: ____________ Movements: ____________ Start time: ____________ Finish time: ____________ Date: ____________ Movements: ____________ Start time: ____________ Finish time: ____________ Date: ____________ Movements: ____________ Start time: ____________ Finish time: ____________ Date: ____________ Movements: ____________ Start time: ____________ Finish time: ____________  Date: ____________ Movements: ____________ Start time: ____________ Finish time: ____________ Date: ____________ Movements: ____________ Start time: ____________ Finish time: ____________ Date: ____________ Movements: ____________ Start time: ____________ Finish time: ____________ Date: ____________ Movements: ____________ Start time: ____________ Finish time: ____________ Date: ____________ Movements: ____________ Start time: ____________ Finish time: ____________ Date: ____________ Movements: ____________ Start time: ____________ Finish time: ____________ Date: ____________ Movements: ____________ Start time: ____________ Finish time: ____________  Date: ____________ Movements: ____________ Start time: ____________ Finish time: ____________ Date: ____________ Movements: ____________ Start time: ____________ Finish time: ____________ Date: ____________ Movements: ____________ Start time: ____________ Finish time: ____________ Date: ____________ Movements:  ____________ Start time: ____________ Finish time: ____________ Date: ____________ Movements: ____________ Start time: ____________ Finish time: ____________ Date: ____________ Movements: ____________ Start time: ____________ Finish time: ____________ Date: ____________ Movements: ____________ Start time: ____________ Finish time: ____________  Date: ____________ Movements: ____________ Start time: ____________ Finish time: ____________ Date: ____________ Movements: ____________ Start time: ____________ Finish time: ____________ Date: ____________ Movements: ____________ Start time:   ____________ Finish time: ____________ Date: ____________ Movements: ____________ Start time: ____________ Finish time: ____________ Date: ____________ Movements: ____________ Start time: ____________ Finish time: ____________ Date: ____________ Movements: ____________ Start time: ____________ Finish time: ____________ Date: ____________ Movements: ____________ Start time: ____________ Finish time: ____________  Date: ____________ Movements: ____________ Start time: ____________ Finish time: ____________ Date: ____________ Movements: ____________ Start time: ____________ Finish time: ____________ Date: ____________ Movements: ____________ Start time: ____________ Finish time: ____________ Date: ____________ Movements: ____________ Start time: ____________ Finish time: ____________ Date: ____________ Movements: ____________ Start time: ____________ Finish time: ____________ Date: ____________ Movements: ____________ Start time: ____________ Finish time: ____________ Date: ____________ Movements: ____________ Start time: ____________ Finish time: ____________  Date: ____________ Movements: ____________ Start time: ____________ Finish time: ____________ Date: ____________ Movements: ____________ Start time: ____________ Finish time: ____________ Date: ____________ Movements: ____________ Start time: ____________ Finish  time: ____________ Date: ____________ Movements: ____________ Start time: ____________ Finish time: ____________ Date: ____________ Movements: ____________ Start time: ____________ Finish time: ____________ Date: ____________ Movements: ____________ Start time: ____________ Finish time: ____________ Date: ____________ Movements: ____________ Start time: ____________ Finish time: ____________  Date: ____________ Movements: ____________ Start time: ____________ Finish time: ____________ Date: ____________ Movements: ____________ Start time: ____________ Finish time: ____________ Date: ____________ Movements: ____________ Start time: ____________ Finish time: ____________ Date: ____________ Movements: ____________ Start time: ____________ Finish time: ____________ Date: ____________ Movements: ____________ Start time: ____________ Finish time: ____________ Date: ____________ Movements: ____________ Start time: ____________ Finish time: ____________ Document Released: 07/16/2006 Document Revised: 06/05/2011 Document Reviewed: 01/16/2009 Exit[employer] Patient Information 2012 [employer]. 

## 2012-02-17 NOTE — Addendum Note (Signed)
Addended by: Rolla Plate on: 02/17/2012 11:39 AM   Modules accepted: Orders

## 2012-02-17 NOTE — Progress Notes (Signed)
Pt had a cyst on vaginal area that was drained at [facility] on Sunday. Area where cyst was drained has been bleeding. Pt did not bring her list of blood sugars today.

## 2012-02-17 NOTE — Progress Notes (Signed)
GDM fetal echo WNL  [place] today EFW 3-12 63 % AFI 18.3cm cx 4.07 cm  Normal growth.  Pt reports FBS 95-130 2hr pp 120-150 pt started on glyburide 5mg  qhs Pt had drainage of bartholins cyst.  It is still draining and healing ...continue abx with sitz baths GC TOC today RT 1 week

## 2012-02-18 LAB — GC/CHLAMYDIA PROBE AMP, GENITAL: Chlamydia, DNA Probe: NEGATIVE

## 2012-02-19 LAB — US OB FOLLOW UP

## 2012-02-24 ENCOUNTER — Encounter: Payer: Self-pay | Admitting: Obstetrics and Gynecology

## 2012-02-24 ENCOUNTER — Ambulatory Visit (INDEPENDENT_AMBULATORY_CARE_PROVIDER_SITE_OTHER): Payer: Medicaid Other | Admitting: Obstetrics and Gynecology

## 2012-02-24 VITALS — BP 110/60 | Wt 225.0 lb

## 2012-02-24 DIAGNOSIS — O99810 Abnormal glucose complicating pregnancy: Secondary | ICD-10-CM

## 2012-02-24 DIAGNOSIS — O24419 Gestational diabetes mellitus in pregnancy, unspecified control: Secondary | ICD-10-CM

## 2012-02-24 NOTE — Progress Notes (Signed)
[redacted]w[redacted]d Complains of pressure.  Cervix closed and long. Patient did not bring in her blood sugars.  She says they are normal. GC negative.  Chlamydia negative.  RPR nonreactive. Biophysical profile next week. Return office in 1 week. Dr. Stefano Gaul

## 2012-02-24 NOTE — Progress Notes (Signed)
Pt did not bring her list of blood sugars today.

## 2012-03-01 NOTE — Progress Notes (Signed)
erroneous

## 2012-03-02 ENCOUNTER — Emergency Department (INDEPENDENT_AMBULATORY_CARE_PROVIDER_SITE_OTHER)
Admission: EM | Admit: 2012-03-02 | Discharge: 2012-03-02 | Disposition: A | Discharge status: Home / Self Care | Payer: Medicaid Other | Source: Home / Self Care

## 2012-03-02 ENCOUNTER — Encounter (HOSPITAL_COMMUNITY): Payer: Self-pay | Admitting: *Deleted

## 2012-03-02 DIAGNOSIS — K089 Disorder of teeth and supporting structures, unspecified: Secondary | ICD-10-CM

## 2012-03-02 MED ORDER — HYDROCODONE-ACETAMINOPHEN 5-325 MG PO TABS: 2.0000 | ORAL_TABLET | ORAL | Status: AC | PRN

## 2012-03-02 MED ORDER — PENICILLIN V POTASSIUM 500 MG PO TABS: 500.0000 mg | ORAL_TABLET | Freq: Four times a day (QID) | ORAL | Status: DC

## 2012-03-02 MED ORDER — PENICILLIN V POTASSIUM 500 MG PO TABS: 500.0000 mg | ORAL_TABLET | Freq: Four times a day (QID) | ORAL | Status: AC

## 2012-03-02 NOTE — ED Notes (Signed)
Pt seen previously at dentist and given antibiotics with plan to remove several teeth - has not followed through with removing teeth. Pt still taking all antibiotics but has run out of pain meds. Pt educated on importance of antibiotics and following through with removal of teeth. Pt is [redacted] weeks pregnant.

## 2012-03-02 NOTE — ED Provider Notes (Signed)
History     CSN: [id number]  Arrival date & time 03/02/12  1052   None     Chief Complaint  Patient presents with  . Dental Pain    (Consider location/radiation/quality/duration/timing/severity/associated sxs/prior treatment) Patient is a 26 y.o. female presenting with tooth pain. No language interpreter was used.  Dental PainThe primary symptoms include mouth pain. The symptoms began more than 1 week ago. The symptoms are worsening. The symptoms occur constantly.  Additional symptoms include: gum swelling and gum tenderness.    Past Medical History  Diagnosis Date  . Asthma   . Anxiety   . Depression   . ADHD (attention deficit hyperactivity disorder)   . ADHD (attention deficit hyperactivity disorder)   . PTSD (post-traumatic stress disorder)   . Gestational diabetes   . ADHD (attention deficit hyperactivity disorder)     Past Surgical History  Procedure Date  . Cesarean section   . Tonsillectomy   . Addnoidectomy   . Cesarean section 02/10/2011    Procedure: CESAREAN SECTION;  Surgeon: Esmeralda Arthur, MD;  Location: [facility];  Service: Gynecology;  Laterality: N/A;  Repeat  . Cyst on kidney     2009  . Bipolor     Family History  Problem Relation Age of Onset  . Hypertension Mother   . Diabetes Mother   . Seizures Mother   . Migraines Mother   . Stroke Mother   . Autoimmune disease Mother   . Hypertension Father   . Thrombophlebitis Father   . Diabetes Father   . Seizures Father   . Autoimmune disease Father   . Asthma Brother   . Asthma Son   . Autism Son   . Diabetes Paternal Aunt   . Heart disease Maternal Grandmother   . Hypertension Maternal Grandmother   . Schizophrenia Maternal Grandmother   . Hypertension Maternal Grandfather   . Diabetes Maternal Grandfather   . Hypertension Paternal Grandmother   . Hypertension Paternal Grandfather   . Thrombophlebitis Paternal Grandfather   . Diabetes Paternal Grandfather   . Autoimmune disease Paternal  Grandfather     History  Substance Use Topics  . Smoking status: Current Everyday Smoker -- 0.2 packs/day    Types: Cigarettes  . Smokeless tobacco: Never Used  . Alcohol Use: No     every other week     OB History    Grav Para Term Preterm Abortions TAB SAB Ect Mult Living   4 3 3  0 0 0 0 0 0 3      Review of Systems  HENT: Positive for dental problem.   All other systems reviewed and are negative.    Allergies  Review of patient's allergies indicates no known allergies.  Home Medications   Current Outpatient Rx  Name Route Sig Dispense Refill  . GLYBURIDE 5 MG PO TABS Oral Take 1 tablet (5 mg total) by mouth at bedtime. 30 tablet 1  . FLINSTONES GUMMIES OMEGA-3 DHA PO CHEW Oral Chew by mouth. 2 gummies      Pulse 87  Temp 98.6 F (37 C) (Oral)  Resp 18  SpO2 99%  LMP 07/13/2011  Physical Exam  Nursing note and vitals reviewed. Constitutional: She is oriented to person, place, and time. She appears well-developed.  HENT:  Head: Normocephalic and atraumatic.  Right Ear: External ear normal.  Left Ear: External ear normal.       Decay lower left lower teeth  Eyes: Conjunctivae and EOM are normal. Pupils  are equal, round, and reactive to light.  Neck: Normal range of motion.  Cardiovascular: Normal rate.   Pulmonary/Chest: Effort normal.  Musculoskeletal: Normal range of motion.  Neurological: She is alert and oriented to person, place, and time.  Skin: Skin is warm.    ED Course  Procedures (including critical care time)  Labs Reviewed - No data to display No results found.   No diagnosis found.    MDM  pcn vk 500, hydrocodone  And follow up with Dr. Velna Ochs, [place] 03/02/12 1304

## 2012-03-03 NOTE — ED Provider Notes (Signed)
Medical screening examination/treatment/procedure(s) were performed by resident physician or non-physician practitioner and as supervising physician I was immediately available for consultation/collaboration.   Barkley Bruns MD.    Linna Hoff, MD 03/03/12 [phone]

## 2012-03-05 ENCOUNTER — Encounter: Payer: Medicaid Other | Admitting: Obstetrics and Gynecology

## 2012-03-05 ENCOUNTER — Other Ambulatory Visit: Payer: Medicaid Other

## 2012-03-08 ENCOUNTER — Ambulatory Visit (INDEPENDENT_AMBULATORY_CARE_PROVIDER_SITE_OTHER): Payer: Medicaid Other

## 2012-03-08 ENCOUNTER — Encounter: Payer: Self-pay | Admitting: Obstetrics and Gynecology

## 2012-03-08 ENCOUNTER — Telehealth: Payer: Self-pay | Admitting: Obstetrics and Gynecology

## 2012-03-08 ENCOUNTER — Ambulatory Visit (INDEPENDENT_AMBULATORY_CARE_PROVIDER_SITE_OTHER): Payer: Medicaid Other | Admitting: Obstetrics and Gynecology

## 2012-03-08 ENCOUNTER — Other Ambulatory Visit: Payer: Self-pay | Admitting: Obstetrics and Gynecology

## 2012-03-08 VITALS — BP 100/68 | Wt 219.0 lb

## 2012-03-08 DIAGNOSIS — O24419 Gestational diabetes mellitus in pregnancy, unspecified control: Secondary | ICD-10-CM

## 2012-03-08 DIAGNOSIS — O99810 Abnormal glucose complicating pregnancy: Secondary | ICD-10-CM

## 2012-03-08 NOTE — Telephone Encounter (Signed)
Repeat C/S & BTL scheduled for 04/16/12 @ 7:30 with [facility]. -Adrianne Pridgen

## 2012-03-08 NOTE — Progress Notes (Signed)
[redacted]w[redacted]d Fingerstick B/S 98 today - no glucose log. USS BPP today. Result: Vx presentation, posterior Placenta, AFI is normal (35th%) BPP =8/8 No complaints. No change vaginal secretions. FM+ ROB x 2 weeks

## 2012-03-09 ENCOUNTER — Telehealth: Payer: Self-pay | Admitting: Obstetrics and Gynecology

## 2012-03-22 ENCOUNTER — Encounter: Payer: Medicaid Other | Admitting: Obstetrics and Gynecology

## 2012-03-26 ENCOUNTER — Telehealth: Payer: Self-pay | Admitting: Obstetrics and Gynecology

## 2012-03-26 NOTE — Telephone Encounter (Signed)
VM from pt. Was exposed to scabies. Is having itching.  Requests RX. Questioning if should go to ER. Pt [phone]

## 2012-03-26 NOTE — Telephone Encounter (Signed)
Tc to pt regarding msg, lm on vm to call back. 

## 2012-03-29 ENCOUNTER — Encounter: Payer: Medicaid Other | Admitting: Obstetrics and Gynecology

## 2012-03-30 ENCOUNTER — Ambulatory Visit (INDEPENDENT_AMBULATORY_CARE_PROVIDER_SITE_OTHER): Payer: Medicaid Other | Admitting: Obstetrics and Gynecology

## 2012-03-30 ENCOUNTER — Encounter (HOSPITAL_COMMUNITY): Payer: Self-pay | Admitting: Pharmacist

## 2012-03-30 ENCOUNTER — Telehealth: Payer: Self-pay | Admitting: Obstetrics and Gynecology

## 2012-03-30 VITALS — BP 120/70 | Wt 220.0 lb

## 2012-03-30 DIAGNOSIS — O24419 Gestational diabetes mellitus in pregnancy, unspecified control: Secondary | ICD-10-CM

## 2012-03-30 DIAGNOSIS — Z331 Pregnant state, incidental: Secondary | ICD-10-CM

## 2012-03-30 DIAGNOSIS — Z3490 Encounter for supervision of normal pregnancy, unspecified, unspecified trimester: Secondary | ICD-10-CM

## 2012-03-30 DIAGNOSIS — O99810 Abnormal glucose complicating pregnancy: Secondary | ICD-10-CM

## 2012-03-30 NOTE — Telephone Encounter (Signed)
Spoke with pt rgd msg informed pt ofice returning her call from msg last week ot states she went to er for eval

## 2012-03-30 NOTE — Progress Notes (Signed)
[redacted]w[redacted]d GDM -  U/S for EFW at [place] [facility] and Labor Precautions RTO 1wk Repeat C/S and BTL scheduled for 10/18 GBS today Pt is noncompliant - says she is not taking the 5mg  of glyburide and not really checking cbgs.  She says she is stressed bc of the 26yr old, now preg and moving without help Check HgA1C today

## 2012-03-31 LAB — HEMOGLOBIN A1C: Mean Plasma Glucose: 111 mg/dL (ref <117)

## 2012-04-05 ENCOUNTER — Other Ambulatory Visit: Payer: Self-pay | Admitting: Obstetrics and Gynecology

## 2012-04-05 ENCOUNTER — Telehealth: Payer: Self-pay | Admitting: Obstetrics and Gynecology

## 2012-04-05 DIAGNOSIS — O99810 Abnormal glucose complicating pregnancy: Secondary | ICD-10-CM

## 2012-04-06 ENCOUNTER — Other Ambulatory Visit: Payer: Self-pay | Admitting: Obstetrics and Gynecology

## 2012-04-06 ENCOUNTER — Encounter (HOSPITAL_COMMUNITY): Payer: Self-pay

## 2012-04-06 ENCOUNTER — Ambulatory Visit (INDEPENDENT_AMBULATORY_CARE_PROVIDER_SITE_OTHER): Payer: Medicaid Other | Admitting: Obstetrics and Gynecology

## 2012-04-06 ENCOUNTER — Encounter (HOSPITAL_COMMUNITY)
Admission: RE | Admit: 2012-04-06 | Discharge: 2012-04-06 | Disposition: A | Discharge status: Home / Self Care | Payer: Medicaid Other | Source: Ambulatory Visit | Attending: Obstetrics and Gynecology | Admitting: Obstetrics and Gynecology

## 2012-04-06 ENCOUNTER — Encounter: Payer: Self-pay | Admitting: Obstetrics and Gynecology

## 2012-04-06 ENCOUNTER — Telehealth: Payer: Self-pay | Admitting: Obstetrics and Gynecology

## 2012-04-06 ENCOUNTER — Ambulatory Visit (INDEPENDENT_AMBULATORY_CARE_PROVIDER_SITE_OTHER): Payer: Medicaid Other

## 2012-04-06 ENCOUNTER — Other Ambulatory Visit: Payer: Self-pay

## 2012-04-06 VITALS — BP 103/72 | HR 94 | Ht 67.0 in | Wt 222.0 lb

## 2012-04-06 VITALS — BP 110/64 | Wt 222.0 lb

## 2012-04-06 DIAGNOSIS — N760 Acute vaginitis: Secondary | ICD-10-CM

## 2012-04-06 DIAGNOSIS — O99810 Abnormal glucose complicating pregnancy: Secondary | ICD-10-CM

## 2012-04-06 DIAGNOSIS — O24419 Gestational diabetes mellitus in pregnancy, unspecified control: Secondary | ICD-10-CM

## 2012-04-06 DIAGNOSIS — O09899 Supervision of other high risk pregnancies, unspecified trimester: Secondary | ICD-10-CM

## 2012-04-06 DIAGNOSIS — O34219 Maternal care for unspecified type scar from previous cesarean delivery: Secondary | ICD-10-CM

## 2012-04-06 DIAGNOSIS — Z331 Pregnant state, incidental: Secondary | ICD-10-CM

## 2012-04-06 DIAGNOSIS — B9620 Unspecified Escherichia coli [E. coli] as the cause of diseases classified elsewhere: Secondary | ICD-10-CM

## 2012-04-06 DIAGNOSIS — Z658 Other specified problems related to psychosocial circumstances: Secondary | ICD-10-CM

## 2012-04-06 LAB — CBC
HCT: 33.3 % — ABNORMAL LOW (ref 36.0–46.0)
MCHC: 33.6 g/dL (ref 30.0–36.0)
Platelets: 185 10*3/uL (ref 150–400)
RDW: 13.7 % (ref 11.5–15.5)
WBC: 6 10*3/uL (ref 4.0–10.5)

## 2012-04-06 LAB — BASIC METABOLIC PANEL
BUN: 5 mg/dL — ABNORMAL LOW (ref 6–23)
CO2: 23 mEq/L (ref 19–32)
Calcium: 9.2 mg/dL (ref 8.4–10.5)
Chloride: 102 mEq/L (ref 96–112)
Creatinine, Ser: 0.7 mg/dL (ref 0.50–1.10)
GFR calc Af Amer: >90 mL/min (ref >90)

## 2012-04-06 LAB — US OB FOLLOW UP

## 2012-04-06 LAB — TYPE AND SCREEN: ABO/RH(D): B POS

## 2012-04-06 LAB — HIV ANTIBODY (ROUTINE TESTING W REFLEX): HIV: NONREACTIVE

## 2012-04-06 LAB — HEPATITIS B SURFACE ANTIGEN: Hepatitis B Surface Ag: NEGATIVE

## 2012-04-06 LAB — SURGICAL PCR SCREEN: Staphylococcus aureus: NEGATIVE

## 2012-04-06 NOTE — Telephone Encounter (Signed)
RPR done 01/2012=neg. Will add HIV, Rh anitbody, and Hep B labs to appt on 04/06/12.

## 2012-04-06 NOTE — Patient Instructions (Addendum)
   Your procedure is scheduled on: Friday, Oct.18  Enter through the Main Entrance of [facility] at: 6 am Pick up the phone at the desk and dial [phone] and inform us of your arrival.  Please call this number if you have any problems the morning of surgery: [phone]  Remember: Do not eat food after midnight: Thursday Do not drink clear liquids after: Thursday Take these medicines the morning of surgery with a SIP OF WATER: None  Do not wear jewelry, make-up, or FINGER nail polish No metal in your hair or on your body. Do not wear lotions, powders, perfumes. You may wear deodorant.  Please use your CHG wash as directed prior to surgery.  Do not shave anywhere for at least 12 hours prior to first CHG shower.  Do not bring valuables to the hospital. Contacts, dentures or bridgework may not be worn into surgery.  Leave suitcase in the car. After Surgery it may be brought to your room. For patients being admitted to the hospital, checkout time is 11:00am the day of discharge.  Home with mother Katharina Caper.  Patients discharged on the day of surgery will not be allowed to drive home.

## 2012-04-06 NOTE — Addendum Note (Signed)
Addended by: Lerry Liner D on: 04/06/2012 02:40 PM   Modules accepted: Orders

## 2012-04-06 NOTE — Progress Notes (Signed)
[redacted]w[redacted]d GBS + 03/30/12 Request cervix check. Ultrasound shows:  EFW 7lb 3oz  75%ILE      [place] EDD: 04/25/2012            AFI: 12.6            Cervical length:  Not measured            Placenta localization: posterior            Fetal presentation: Vertex    Anatomy survey completed Yes    Anatomy survey is normal     Comments: Vertex presentation. Posterior placenta.  Normal fluid: AFI = 50 th%tile.  Normal growth: EFW = 75 th%tile.    GDM;  Not taking glyburide or checking cbgs regularly.   Thinks FBS is 70-90 and 2hrs less than 130.  Encouraged to follow treatment recommendationsl

## 2012-04-13 ENCOUNTER — Encounter: Payer: Medicaid Other | Admitting: Obstetrics and Gynecology

## 2012-04-14 ENCOUNTER — Ambulatory Visit (INDEPENDENT_AMBULATORY_CARE_PROVIDER_SITE_OTHER): Payer: Medicaid Other | Admitting: Obstetrics and Gynecology

## 2012-04-14 ENCOUNTER — Encounter: Payer: Self-pay | Admitting: Obstetrics and Gynecology

## 2012-04-14 VITALS — BP 120/64 | Wt 223.5 lb

## 2012-04-14 DIAGNOSIS — Z331 Pregnant state, incidental: Secondary | ICD-10-CM

## 2012-04-14 NOTE — Progress Notes (Signed)
Pt scheduled for c-section this Friday.

## 2012-04-14 NOTE — Progress Notes (Signed)
[redacted]w[redacted]d The patient is ready for cesarean section in 2 days. Risk and benefits reviewed. Chest: Clear.  Heart: Regular rate and rhythm.  Abdomen: Gravid and nontender. Patient may not want tubal sterilization. Dr. Stefano Gaul

## 2012-04-15 ENCOUNTER — Encounter (HOSPITAL_COMMUNITY): Payer: Self-pay | Admitting: Obstetrics and Gynecology

## 2012-04-15 DIAGNOSIS — O99820 Streptococcus B carrier state complicating pregnancy: Secondary | ICD-10-CM | POA: Insufficient documentation

## 2012-04-16 ENCOUNTER — Inpatient Hospital Stay (HOSPITAL_COMMUNITY): Payer: Medicaid Other | Admitting: Anesthesiology

## 2012-04-16 ENCOUNTER — Encounter (HOSPITAL_COMMUNITY): Payer: Self-pay | Admitting: Anesthesiology

## 2012-04-16 ENCOUNTER — Inpatient Hospital Stay (HOSPITAL_COMMUNITY)
Admission: RE | Admit: 2012-04-16 | Discharge: 2012-04-19 | DRG: 766 | Disposition: A | Discharge status: Home / Self Care | Payer: Medicaid Other | Source: Ambulatory Visit | Attending: Obstetrics and Gynecology | Admitting: Obstetrics and Gynecology

## 2012-04-16 ENCOUNTER — Encounter (HOSPITAL_COMMUNITY)
Admission: RE | Disposition: A | Discharge status: Home / Self Care | Payer: Self-pay | Source: Ambulatory Visit | Attending: Obstetrics and Gynecology

## 2012-04-16 ENCOUNTER — Encounter (HOSPITAL_COMMUNITY): Payer: Self-pay | Admitting: *Deleted

## 2012-04-16 DIAGNOSIS — Z302 Encounter for sterilization: Secondary | ICD-10-CM

## 2012-04-16 DIAGNOSIS — Z98891 History of uterine scar from previous surgery: Secondary | ICD-10-CM

## 2012-04-16 DIAGNOSIS — O34219 Maternal care for unspecified type scar from previous cesarean delivery: Secondary | ICD-10-CM

## 2012-04-16 DIAGNOSIS — Z658 Other specified problems related to psychosocial circumstances: Secondary | ICD-10-CM

## 2012-04-16 DIAGNOSIS — O99814 Abnormal glucose complicating childbirth: Secondary | ICD-10-CM | POA: Diagnosis present

## 2012-04-16 DIAGNOSIS — O9903 Anemia complicating the puerperium: Secondary | ICD-10-CM | POA: Diagnosis not present

## 2012-04-16 DIAGNOSIS — Z9851 Tubal ligation status: Secondary | ICD-10-CM

## 2012-04-16 DIAGNOSIS — O24419 Gestational diabetes mellitus in pregnancy, unspecified control: Secondary | ICD-10-CM | POA: Diagnosis present

## 2012-04-16 DIAGNOSIS — O09899 Supervision of other high risk pregnancies, unspecified trimester: Secondary | ICD-10-CM

## 2012-04-16 DIAGNOSIS — O99892 Other specified diseases and conditions complicating childbirth: Secondary | ICD-10-CM | POA: Diagnosis present

## 2012-04-16 DIAGNOSIS — B9620 Unspecified Escherichia coli [E. coli] as the cause of diseases classified elsewhere: Secondary | ICD-10-CM

## 2012-04-16 DIAGNOSIS — Z22330 Carrier of Group B streptococcus: Secondary | ICD-10-CM

## 2012-04-16 DIAGNOSIS — B9689 Other specified bacterial agents as the cause of diseases classified elsewhere: Secondary | ICD-10-CM

## 2012-04-16 DIAGNOSIS — D649 Anemia, unspecified: Secondary | ICD-10-CM | POA: Diagnosis not present

## 2012-04-16 LAB — PREPARE RBC (CROSSMATCH)

## 2012-04-16 LAB — GLUCOSE, CAPILLARY
Glucose-Capillary: 79 mg/dL (ref 70–99)
Glucose-Capillary: 84 mg/dL (ref 70–99)

## 2012-04-16 SURGERY — Surgical Case
Anesthesia: Spinal | Laterality: Bilateral | Wound class: Clean Contaminated

## 2012-04-16 MED ORDER — FENTANYL CITRATE 0.05 MG/ML IJ SOLN
INTRAMUSCULAR | Status: AC
  Administered 2012-04-16: 50 ug via INTRAVENOUS
  Filled 2012-04-16: qty 2

## 2012-04-16 MED ORDER — DIBUCAINE 1 % RE OINT: 1.0000 "application " | TOPICAL_OINTMENT | RECTAL | Status: DC | PRN

## 2012-04-16 MED ORDER — DIPHENHYDRAMINE HCL 25 MG PO CAPS: 25.0000 mg | ORAL_CAPSULE | ORAL | Status: DC | PRN

## 2012-04-16 MED ORDER — SCOPOLAMINE 1 MG/3DAYS TD PT72
1.0000 | MEDICATED_PATCH | Freq: Once | TRANSDERMAL | Status: DC
  Filled 2012-04-16: qty 1

## 2012-04-16 MED ORDER — NALBUPHINE HCL 10 MG/ML IJ SOLN
5.0000 mg | INTRAMUSCULAR | Status: DC | PRN
  Administered 2012-04-16: 5 mg via SUBCUTANEOUS
  Filled 2012-04-16: qty 1

## 2012-04-16 MED ORDER — METOCLOPRAMIDE HCL 5 MG/ML IJ SOLN: 10.0000 mg | Freq: Three times a day (TID) | INTRAMUSCULAR | Status: DC | PRN

## 2012-04-16 MED ORDER — ONDANSETRON HCL 4 MG PO TABS: 4.0000 mg | ORAL_TABLET | ORAL | Status: DC | PRN

## 2012-04-16 MED ORDER — DIPHENHYDRAMINE HCL 50 MG/ML IJ SOLN
INTRAMUSCULAR | Status: AC
  Filled 2012-04-16: qty 1

## 2012-04-16 MED ORDER — FENTANYL CITRATE 0.05 MG/ML IJ SOLN
25.0000 ug | INTRAMUSCULAR | Status: DC | PRN
  Administered 2012-04-16 (×2): 50 ug via INTRAVENOUS

## 2012-04-16 MED ORDER — PHENYLEPHRINE HCL 10 MG/ML IJ SOLN
INTRAMUSCULAR | Status: DC | PRN
  Administered 2012-04-16: 40 ug via INTRAVENOUS
  Administered 2012-04-16: 80 ug via INTRAVENOUS

## 2012-04-16 MED ORDER — SCOPOLAMINE 1 MG/3DAYS TD PT72
MEDICATED_PATCH | TRANSDERMAL | Status: AC
  Filled 2012-04-16: qty 1

## 2012-04-16 MED ORDER — DIPHENHYDRAMINE HCL 25 MG PO CAPS: 25.0000 mg | ORAL_CAPSULE | Freq: Four times a day (QID) | ORAL | Status: DC | PRN

## 2012-04-16 MED ORDER — OXYTOCIN 40 UNITS IN LACTATED RINGERS INFUSION - SIMPLE MED: 62.5000 mL/h | INTRAVENOUS | Status: AC

## 2012-04-16 MED ORDER — IBUPROFEN 600 MG PO TABS
600.0000 mg | ORAL_TABLET | Freq: Four times a day (QID) | ORAL | Status: DC | PRN
  Filled 2012-04-16 (×8): qty 1

## 2012-04-16 MED ORDER — ALBUTEROL SULFATE HFA 108 (90 BASE) MCG/ACT IN AERS: 1.0000 | INHALATION_SPRAY | Freq: Four times a day (QID) | RESPIRATORY_TRACT | Status: DC | PRN

## 2012-04-16 MED ORDER — KETOROLAC TROMETHAMINE 60 MG/2ML IM SOLN
60.0000 mg | Freq: Once | INTRAMUSCULAR | Status: AC | PRN
  Administered 2012-04-16: 60 mg via INTRAMUSCULAR

## 2012-04-16 MED ORDER — BUPIVACAINE HCL (PF) 0.25 % IJ SOLN
INTRAMUSCULAR | Status: DC | PRN
  Administered 2012-04-16: 10 mL
  Administered 2012-04-16: 20 mL

## 2012-04-16 MED ORDER — SODIUM CHLORIDE 0.9 % IV SOLN
1.0000 ug/kg/h | INTRAVENOUS | Status: DC | PRN
  Filled 2012-04-16: qty 2.5

## 2012-04-16 MED ORDER — KETOROLAC TROMETHAMINE 60 MG/2ML IM SOLN
INTRAMUSCULAR | Status: AC
  Administered 2012-04-16: 60 mg via INTRAMUSCULAR
  Filled 2012-04-16: qty 2

## 2012-04-16 MED ORDER — MORPHINE SULFATE (PF) 0.5 MG/ML IJ SOLN
INTRAMUSCULAR | Status: DC | PRN
  Administered 2012-04-16: .15 mg via INTRATHECAL

## 2012-04-16 MED ORDER — ONDANSETRON HCL 4 MG/2ML IJ SOLN
INTRAMUSCULAR | Status: DC | PRN
  Administered 2012-04-16: 4 mg via INTRAVENOUS

## 2012-04-16 MED ORDER — NALBUPHINE HCL 10 MG/ML IJ SOLN
5.0000 mg | INTRAMUSCULAR | Status: DC | PRN
  Filled 2012-04-16: qty 1

## 2012-04-16 MED ORDER — PHENYLEPHRINE 40 MCG/ML (10ML) SYRINGE FOR IV PUSH (FOR BLOOD PRESSURE SUPPORT)
PREFILLED_SYRINGE | INTRAVENOUS | Status: AC
  Filled 2012-04-16: qty 5

## 2012-04-16 MED ORDER — OXYTOCIN 10 UNIT/ML IJ SOLN
INTRAMUSCULAR | Status: AC
  Filled 2012-04-16: qty 4

## 2012-04-16 MED ORDER — NALBUPHINE SYRINGE 5 MG/0.5 ML
INJECTION | INTRAMUSCULAR | Status: AC
  Administered 2012-04-16: 5 mg via SUBCUTANEOUS
  Filled 2012-04-16: qty 0.5

## 2012-04-16 MED ORDER — OXYCODONE-ACETAMINOPHEN 5-325 MG PO TABS
1.0000 | ORAL_TABLET | ORAL | Status: DC | PRN
  Administered 2012-04-16: 1 via ORAL
  Administered 2012-04-17 – 2012-04-18 (×6): 2 via ORAL
  Filled 2012-04-16 (×6): qty 2
  Filled 2012-04-16: qty 1

## 2012-04-16 MED ORDER — SCOPOLAMINE 1 MG/3DAYS TD PT72: 1.0000 | MEDICATED_PATCH | Freq: Once | TRANSDERMAL | Status: DC

## 2012-04-16 MED ORDER — KETOROLAC TROMETHAMINE 30 MG/ML IJ SOLN: 30.0000 mg | Freq: Four times a day (QID) | INTRAMUSCULAR | Status: AC | PRN

## 2012-04-16 MED ORDER — FENTANYL CITRATE 0.05 MG/ML IJ SOLN
INTRAMUSCULAR | Status: AC
  Filled 2012-04-16: qty 2

## 2012-04-16 MED ORDER — SENNOSIDES-DOCUSATE SODIUM 8.6-50 MG PO TABS
2.0000 | ORAL_TABLET | Freq: Every day | ORAL | Status: DC
  Administered 2012-04-16 – 2012-04-18 (×3): 2 via ORAL

## 2012-04-16 MED ORDER — LACTATED RINGERS IV SOLN
Freq: Once | INTRAVENOUS | Status: AC
  Administered 2012-04-16 (×4): via INTRAVENOUS

## 2012-04-16 MED ORDER — MORPHINE SULFATE 0.5 MG/ML IJ SOLN
INTRAMUSCULAR | Status: AC
  Filled 2012-04-16: qty 10

## 2012-04-16 MED ORDER — DIPHENHYDRAMINE HCL 50 MG/ML IJ SOLN: 12.5000 mg | INTRAMUSCULAR | Status: DC | PRN

## 2012-04-16 MED ORDER — TETANUS-DIPHTH-ACELL PERTUSSIS 5-2.5-18.5 LF-MCG/0.5 IM SUSP: 0.5000 mL | Freq: Once | INTRAMUSCULAR | Status: DC

## 2012-04-16 MED ORDER — DIPHENHYDRAMINE HCL 50 MG/ML IJ SOLN: 25.0000 mg | INTRAMUSCULAR | Status: DC | PRN

## 2012-04-16 MED ORDER — PRENATAL MULTIVITAMIN CH
1.0000 | ORAL_TABLET | Freq: Every day | ORAL | Status: DC
  Administered 2012-04-18: 1 via ORAL
  Filled 2012-04-16 (×3): qty 1

## 2012-04-16 MED ORDER — BUPIVACAINE IN DEXTROSE 0.75-8.25 % IT SOLN
INTRATHECAL | Status: DC | PRN
  Administered 2012-04-16: 14 mg via INTRATHECAL

## 2012-04-16 MED ORDER — ONDANSETRON HCL 4 MG/2ML IJ SOLN: 4.0000 mg | Freq: Three times a day (TID) | INTRAMUSCULAR | Status: DC | PRN

## 2012-04-16 MED ORDER — ZOLPIDEM TARTRATE 5 MG PO TABS: 5.0000 mg | ORAL_TABLET | Freq: Every evening | ORAL | Status: DC | PRN

## 2012-04-16 MED ORDER — CEFAZOLIN SODIUM-DEXTROSE 2-3 GM-% IV SOLR
2.0000 g | INTRAVENOUS | Status: AC
  Administered 2012-04-16: 2 g via INTRAVENOUS

## 2012-04-16 MED ORDER — KETOROLAC TROMETHAMINE 30 MG/ML IJ SOLN
30.0000 mg | Freq: Four times a day (QID) | INTRAMUSCULAR | Status: AC | PRN
  Administered 2012-04-16: 30 mg via INTRAVENOUS
  Filled 2012-04-16: qty 1

## 2012-04-16 MED ORDER — ONDANSETRON HCL 4 MG/2ML IJ SOLN: 4.0000 mg | INTRAMUSCULAR | Status: DC | PRN

## 2012-04-16 MED ORDER — MEPERIDINE HCL 25 MG/ML IJ SOLN: 6.2500 mg | INTRAMUSCULAR | Status: DC | PRN

## 2012-04-16 MED ORDER — CEFAZOLIN SODIUM-DEXTROSE 2-3 GM-% IV SOLR
INTRAVENOUS | Status: AC
  Filled 2012-04-16: qty 50

## 2012-04-16 MED ORDER — BUPIVACAINE HCL (PF) 0.25 % IJ SOLN
INTRAMUSCULAR | Status: AC
  Filled 2012-04-16: qty 30

## 2012-04-16 MED ORDER — FENTANYL CITRATE 0.05 MG/ML IJ SOLN
INTRAMUSCULAR | Status: DC | PRN
  Administered 2012-04-16: 25 ug via INTRATHECAL

## 2012-04-16 MED ORDER — LACTATED RINGERS IV SOLN
INTRAVENOUS | Status: DC
  Administered 2012-04-16: 20:00:00 via INTRAVENOUS

## 2012-04-16 MED ORDER — SIMETHICONE 80 MG PO CHEW
80.0000 mg | CHEWABLE_TABLET | Freq: Three times a day (TID) | ORAL | Status: DC
  Administered 2012-04-16 – 2012-04-18 (×8): 80 mg via ORAL

## 2012-04-16 MED ORDER — SIMETHICONE 80 MG PO CHEW: 80.0000 mg | CHEWABLE_TABLET | ORAL | Status: DC | PRN

## 2012-04-16 MED ORDER — DIPHENHYDRAMINE HCL 50 MG/ML IJ SOLN
INTRAMUSCULAR | Status: DC | PRN
  Administered 2012-04-16: 25 mg via INTRAVENOUS

## 2012-04-16 MED ORDER — ONDANSETRON HCL 4 MG/2ML IJ SOLN
INTRAMUSCULAR | Status: AC
  Filled 2012-04-16: qty 2

## 2012-04-16 MED ORDER — OXYTOCIN 10 UNIT/ML IJ SOLN
40.0000 [IU] | INTRAVENOUS | Status: DC | PRN
  Administered 2012-04-16: 40 [IU] via INTRAVENOUS

## 2012-04-16 MED ORDER — WITCH HAZEL-GLYCERIN EX PADS: 1.0000 "application " | MEDICATED_PAD | CUTANEOUS | Status: DC | PRN

## 2012-04-16 MED ORDER — MENTHOL 3 MG MT LOZG: 1.0000 | LOZENGE | OROMUCOSAL | Status: DC | PRN

## 2012-04-16 MED ORDER — NALOXONE HCL 0.4 MG/ML IJ SOLN: 0.4000 mg | INTRAMUSCULAR | Status: DC | PRN

## 2012-04-16 MED ORDER — IBUPROFEN 600 MG PO TABS
600.0000 mg | ORAL_TABLET | Freq: Four times a day (QID) | ORAL | Status: DC
  Administered 2012-04-17 – 2012-04-19 (×9): 600 mg via ORAL
  Filled 2012-04-16 (×2): qty 1

## 2012-04-16 MED ORDER — SODIUM CHLORIDE 0.9 % IJ SOLN: 3.0000 mL | INTRAMUSCULAR | Status: DC | PRN

## 2012-04-16 MED ORDER — LANOLIN HYDROUS EX OINT: 1.0000 "application " | TOPICAL_OINTMENT | CUTANEOUS | Status: DC | PRN

## 2012-04-16 MED ORDER — 0.9 % SODIUM CHLORIDE (POUR BTL) OPTIME
TOPICAL | Status: DC | PRN
  Administered 2012-04-16: 1000 mL

## 2012-04-16 SURGICAL SUPPLY — 56 items
BENZOIN TINCTURE PRP APPL 2/3 (GAUZE/BANDAGES/DRESSINGS) ×2 IMPLANT
BLADE EXTENDED COATED 6.5IN (ELECTRODE) IMPLANT
BLADE HEX COATED 2.75 (ELECTRODE) IMPLANT
BOOTIES KNEE HIGH SLOAN (MISCELLANEOUS) ×4 IMPLANT
CLOTH BEACON ORANGE TIMEOUT ST (SAFETY) ×2 IMPLANT
CONTAINER PREFILL 10% NBF 15ML (MISCELLANEOUS) ×4 IMPLANT
DERMABOND ADVANCED (GAUZE/BANDAGES/DRESSINGS)
DERMABOND ADVANCED .7 DNX12 (GAUZE/BANDAGES/DRESSINGS) IMPLANT
DRAIN JACKSON PRT FLT 7MM (DRAIN) IMPLANT
DRAPE SURG 17X23 STRL (DRAPES) ×2 IMPLANT
DRESSING TELFA 8X3 (GAUZE/BANDAGES/DRESSINGS) ×4 IMPLANT
DRSG COVADERM 4X10 (GAUZE/BANDAGES/DRESSINGS) IMPLANT
DURAPREP 26ML APPLICATOR (WOUND CARE) ×2 IMPLANT
ELECT REM PT RETURN 9FT ADLT (ELECTROSURGICAL) ×2
ELECTRODE REM PT RTRN 9FT ADLT (ELECTROSURGICAL) ×1 IMPLANT
EVACUATOR SILICONE 100CC (DRAIN) IMPLANT
EXTRACTOR VACUUM KIWI (MISCELLANEOUS) IMPLANT
EXTRACTOR VACUUM M CUP 4 TUBE (SUCTIONS) IMPLANT
GAUZE SPONGE 4X4 12PLY STRL LF (GAUZE/BANDAGES/DRESSINGS) ×4 IMPLANT
GLOVE BIO SURGEON STRL SZ 6.5 (GLOVE) ×2 IMPLANT
GLOVE BIOGEL PI IND STRL 6.5 (GLOVE) ×2 IMPLANT
GLOVE BIOGEL PI IND STRL 7.0 (GLOVE) ×1 IMPLANT
GLOVE BIOGEL PI INDICATOR 6.5 (GLOVE) ×2
GLOVE BIOGEL PI INDICATOR 7.0 (GLOVE) ×1
GLOVE ECLIPSE 6.0 STRL STRAW (GLOVE) ×4 IMPLANT
GLOVE SURG SS PI 6.5 STRL IVOR (GLOVE) ×4 IMPLANT
GOWN PREVENTION PLUS LG XLONG (DISPOSABLE) ×10 IMPLANT
KIT ABG SYR 3ML LUER SLIP (SYRINGE) ×2 IMPLANT
NEEDLE HYPO 25X5/8 SAFETYGLIDE (NEEDLE) ×2 IMPLANT
NEEDLE SPNL 22GX3.5 QUINCKE BK (NEEDLE) ×2 IMPLANT
NS IRRIG 1000ML POUR BTL (IV SOLUTION) ×2 IMPLANT
PACK C SECTION WH (CUSTOM PROCEDURE TRAY) ×2 IMPLANT
PAD ABD 7.5X8 STRL (GAUZE/BANDAGES/DRESSINGS) ×4 IMPLANT
PAD OB MATERNITY 4.3X12.25 (PERSONAL CARE ITEMS) ×2 IMPLANT
RETRACTOR WND ALEXIS 25 LRG (MISCELLANEOUS) ×1 IMPLANT
RTRCTR WOUND ALEXIS 25CM LRG (MISCELLANEOUS) ×2
SLEEVE SCD COMPRESS KNEE MED (MISCELLANEOUS) IMPLANT
SPONGE GAUZE 4X4 12PLY (GAUZE/BANDAGES/DRESSINGS) ×2 IMPLANT
STRIP CLOSURE SKIN 1/4X4 (GAUZE/BANDAGES/DRESSINGS) IMPLANT
SUT CHROMIC 2 0 SH (SUTURE) ×2 IMPLANT
SUT MNCRL AB 3-0 PS2 27 (SUTURE) ×2 IMPLANT
SUT SILK 0 FSL (SUTURE) IMPLANT
SUT VIC AB 0 CT1 27 (SUTURE) ×2
SUT VIC AB 0 CT1 27XBRD ANBCTR (SUTURE) ×2 IMPLANT
SUT VIC AB 0 CT1 36 (SUTURE) ×2 IMPLANT
SUT VIC AB 0 CTX 36 (SUTURE) ×3
SUT VIC AB 0 CTX36XBRD ANBCTRL (SUTURE) ×3 IMPLANT
SUT VIC AB 2-0 CT1 27 (SUTURE) ×2
SUT VIC AB 2-0 CT1 TAPERPNT 27 (SUTURE) ×2 IMPLANT
SUT VIC AB 2-0 SH 27 (SUTURE)
SUT VIC AB 2-0 SH 27XBRD (SUTURE) IMPLANT
SYR CONTROL 10ML LL (SYRINGE) ×2 IMPLANT
TAPE CLOTH SURG 4X10 WHT LF (GAUZE/BANDAGES/DRESSINGS) ×2 IMPLANT
TOWEL OR 17X24 6PK STRL BLUE (TOWEL DISPOSABLE) ×4 IMPLANT
TRAY FOLEY CATH 14FR (SET/KITS/TRAYS/PACK) ×2 IMPLANT
WATER STERILE IRR 1000ML POUR (IV SOLUTION) ×2 IMPLANT

## 2012-04-16 NOTE — H&P (Signed)
Kathleen Collins is a 26 y.o. obese female, G4P3003 at 39 weeks, presenting for RLTCS and BTL this morning. She is a non- compliant GDM (most likely Type 2 diabetic ans had GDM with last 2 pregnancies) The patient was controlled on diet and Glyburide 5mg s po daily during the pregnancy but has been neglectful in keeping blood glucose logs.  Patient Active Problem List  Diagnosis  . ADHD (attention deficit hyperactivity disorder)  . Asthma  . Gestational diabetes  . Previous cesarean delivery affecting pregnancy  . Short interval between pregnancies complicating pregnancy, antepartum  . Psychosocial stressors  . STD (sexually transmitted disease) complicating pregnancy or childbirth, first trimester  . BV (bacterial vaginosis)  . Visual changes  . E-coli UTI  . GBS (group B Streptococcus carrier), +RV culture, currently pregnant    History of present pregnancy: Patient entered care at 7 weeks.  EDC of 04/23/12 was established by 7 weeks USS in [facility]  Anatomy scan was done at 19 weeks and repeated at 21 weeks due to maternal habitus as full anatomy survey was not visiualized. There were normal findings and an posterior placenta.  Further ultrasound BPP were done weekly for fetal surveillance from 32 weeks. 8/8 at all BPP evaluations. The patient was offered to see [facility] for a fetal echo and was compliant - Fetal echo was normal.  Her prenatal course has been complicated by GDM and will need further maternal surveillance for Type 2 Diabetes postpartum.Further signficant events were positive GC in march but has been treated. GBS positive. She also had a Bartholin's cyst drained in August 2013. At her last evalution, she was 04/14/12 with Dr Stefano Gaul at [facility].  OB History    Grav Para Term Preterm Abortions TAB SAB Ect Mult Living   4 3 3  0 0 0 0 0 0 3     Past Medical History  Diagnosis Date  . ADHD (attention deficit hyperactivity disorder)   . ADHD (attention deficit hyperactivity disorder)     . PTSD (post-traumatic stress disorder)   . ADHD (attention deficit hyperactivity disorder)   . Gestational diabetes     rx given but patient states "does not take the med"  . Anxiety     no meds currently  . Depression     currently no meds  . Asthma     rarely use rescue inhaler    Past Surgical History  Procedure Date  . Cesarean section     x 3  . Tonsillectomy   . Addnoidectomy   . Cesarean section 02/10/2011    Procedure: CESAREAN SECTION;  Surgeon: Esmeralda Arthur, MD;  Location: [facility];  Service: Gynecology;  Laterality: N/A;  Repeat  . Cyst on kidney 2009    hx  . Bipolor     no meds currently   Family History: family history includes Asthma in her brother and son; Autism in her son; Autoimmune disease in her father, mother, and paternal grandfather; Diabetes in her father, maternal grandfather, mother, paternal aunt, and paternal grandfather; Heart disease in her maternal grandmother; Hypertension in her father, maternal grandfather, maternal grandmother, mother, paternal grandfather, and paternal grandmother; Migraines in her mother; Schizophrenia in her maternal grandmother; Seizures in her father and mother; Stroke in her mother; and Thrombophlebitis in her father and paternal grandfather. Social History:  reports that she quit smoking about 4 weeks ago. Her smoking use included Cigarettes. She has a 1.5 pack-year smoking history. She has never used smokeless tobacco. She reports that  she uses illicit drugs (Marijuana). She reports that she does not drink alcohol.  ROS:  26 y/o at [redacted]w[redacted]d GDM - Non Compliant on Glyburide 5mg s po/ Diet Control. For RLTCS and BTL. Pre -op this moring and scheduled for surgery with Dr Pennie Rushing at 07.30 hrs 04/16/12.   No Known Allergies     Blood pressure 123/78, pulse 88, temperature 98.2 F (36.8 C), temperature source Oral, resp. rate 18, height 5\' 7"  (1.702 m), weight 210 lb (95.255 kg), last menstrual period 07/13/2011, SpO2 100.00%,  not currently breastfeeding.  Chest clear Heart RRR without murmur Abd gravid, NT, FH  To dates Pelvic:Normal - Cx not checked Ext: - Bartholin's Cyst August 2013  FHR: to be assessed the morning of surgery UCs:  None.  Prenatal labs: ABO, Rh: --/--/B POS (10/08 1222) Antibody: NEG (10/08 1222) Rubella:    Immune RPR: NON REACTIVE (10/08 1222)  HBsAg: NEGATIVE (10/08 1221)  HIV: NON REACTIVE (10/08 1221)  GBS: POSITIVE (10/01 1432) Sickle cell/Hgb electrophoresis: None Pap: None GC:  Tx'ed March 2013 and neg 3rd trimester screening Chlamydia: neg Genetic screenings: neg        Assessment/Plan: IUP at [redacted]w[redacted]d [id number] for RLTCS and BTL with Dr Pennie Rushing this am - scheduled Non - Compliant GDM  Plan: Scheduled  RLTCS and BTL  with Dr Pennie Rushing 04/16/12 07.30 hrs  DAVIES, DENISECNM, [place] 04/16/2012, 6:44 AM

## 2012-04-16 NOTE — Addendum Note (Signed)
Addendum  created 04/16/12 1720 by Armanda Heritage, RN   Modules edited:Charges VN, Notes Section

## 2012-04-16 NOTE — Anesthesia Postprocedure Evaluation (Signed)
  Anesthesia Post-op Note  Patient: Kathleen Collins  Procedure(s) Performed: Procedure(s) (LRB) with comments: CESAREAN SECTION WITH BILATERAL TUBAL LIGATION (Bilateral) - Repeat  Patient Location: PACU and Mother/Baby  Anesthesia Type: Spinal  Level of Consciousness: awake, alert  and oriented  Airway and Oxygen Therapy: Patient Spontanous Breathing  Post-op Pain: mild  Post-op Assessment: Post-op Vital signs reviewed  Post-op Vital Signs: Reviewed and stable  Complications: No apparent anesthesia complications

## 2012-04-16 NOTE — Op Note (Signed)
Cesarean Section Procedure Note  Indications: previous uterine incision kerr x3 or greater   Desire for surgical sterilization  Pre-operative Diagnosis: 39 week 0 day pregnancy. 3 prior cesarean sections.  Desire for surgical sterilization  Post-operative Diagnosis: same  Surgeon: Hal Morales  First Assistant: Kirkland Hun M.D.  Surgeon: Hal Morales   Assistants: Lavera Guise certified nurse midwife  Anesthesia: Spinal anesthesia  ASA Class: 2  Procedure Details  The patient was seen in the Holding Room. The risks, benefits, complications, treatment options, and expected outcomes were discussed with the patient.  The patient concurred with the proposed plan, giving informed consent.  The site of surgery properly noted/marked. The patient was taken to Operating Room # 9, identified as Kathleen Collins and the procedure verified as C-Section Delivery and tubal sterilization. A Time Out was held and the above information confirmed.  After induction of anesthesia, the patient was  prepped with chlor prep in the usual sterile manner.A foley catheter was placed under sterile conditions.  The patient was then draped in the usual fashion.   Suprapubicsubcutaneous injection of 0.25% Bupivacaine   A Pfannenstiel incision was made and carried down through the subcutaneous tissue to the fascia. Fascial incision was made and extended transversely. The fascia was separated from the underlying rectus tissue superiorly and inferiorly. The peritoneum was identified and entered. Peritoneal incision was extended longitudinally.  An Alexis retractor was placed.   A low transverse uterine incision was made two cm above the uterovesical fold, and that incision extended transversely bluntly.The infant was  delivered from occiput transverse presentation was a living female with Apgar scores of 9 at one minute and 9 at five minutes. After the umbilical cord was clamped and cut cord blood was obtained  for evaluation. The placenta was allowed to separate spontaneously from the uterus and was removed from the operative field and given to the representatives of [facility] for cord blood donation.. The uterine outline, tubes and ovaries appeared norma for the gravid state. The uterine incision was closed with running locked sutures of 0 Vicryl. An imbricating layer of sutures was placed. Hemostasis was observed. Lavage was carried out until clear. The left fallopian tube was grasped at the isthmic portion with a Babcock clamp and followed to its fimbriated end. A suture was placed at the isthmic portion through the mesial salpinx in a clear space and tied fore and aft on the knuckle of tube. A second ligature was placed proximal to that. The intervening knuckle of tube was excised and the cut ends cauterized. Hemostasis was noted to be adequate. A similar procedure was carried out on the opposite side.  The peritoneum was closed with a running suture of 2-0 Vicryl.  The rectus muscles were reapproximated with a figure of 8 suture of 2-0 Vicryl.  The fascia was then reapproximated with a running sutures of 0 Vicryl .Renforcing figure of 8 sutures of 0Vicryl were placed on either side of midline.   The skin was reapproximated with 3-0 moncryl.  A sterile dressing was applied. The patient was taken to the recovery room in satisfactory condition having tolerated the procedure well. The infant went to the floor of the full-term nursery.  Instrument, sponge, and needle counts were correct prior to the abdominal closure and at the conclusion of the case.   Findings:  Placenta contained a 3vessel cord    Estimated Blood Loss:  500 cc         Drains: None  Total IV Fluids: 2000 ml         Specimens: Placenta to birthing suite         Implants: None         Complications: ::"None; patient tolerated the procedure well."         Disposition: PACU - hemodynamically stable.           Condition: stable  Attending Attestation: I performed the procedure.  HAYGOOD,VANESSA P  04/16/2012 11:33 AM

## 2012-04-16 NOTE — Anesthesia Postprocedure Evaluation (Signed)
  Anesthesia Post-op Note  Patient: Kathleen Collins  Procedure(s) Performed: Procedure(s) (LRB) with comments: CESAREAN SECTION WITH BILATERAL TUBAL LIGATION (Bilateral) - Repeat  Patient Location: PACU  Anesthesia Type: Spinal  Level of Consciousness: awake, alert  and oriented  Airway and Oxygen Therapy: Patient Spontanous Breathing  Post-op Pain: none  Post-op Assessment: Post-op Vital signs reviewed, Patient's Cardiovascular Status Stable, Respiratory Function Stable, Patent Airway, No signs of Nausea or vomiting, Pain level controlled, No headache and No backache  Post-op Vital Signs: Reviewed and stable  Complications: No apparent anesthesia complications

## 2012-04-16 NOTE — H&P (Signed)
  History and Physical Interval Note:   04/16/2012   7:40 AM   Kathleen Collins  has presented today for surgery, with the diagnosis of Prior Cesarean Section, Desire for Sterilzation  The various methods of treatment have been discussed with the patient and family. After consideration of risks, benefits and other options for treatment, the patient has consented to  Procedure(s): CESAREAN SECTION WITH BILATERAL TUBAL LIGATION as a surgical intervention.  Patient desires permanent sterilization.  Other reversible forms of contraception were discussed with patient; she declines all other modalities. Risks of procedure discussed with patient including but not limited to: risk of regret, permanence of method, bleeding, infection, injury to surrounding organs and need for additional procedures.  Failure risk of 0.5-1% with increased risk of ectopic gestation if pregnancy occurs was also discussed with patient.  Patient verbalized understanding of these risks and wants to proceed with sterilization.  Written informed consent obtained.    I have reviewed the patients' chart and labs.  Questions were answered to the patient's satisfaction.     Hal Morales  MD

## 2012-04-16 NOTE — Anesthesia Procedure Notes (Signed)
Spinal  Patient location during procedure: OR Start time: 04/16/2012 7:58 AM Staffing Anesthesiologist: Kelsey Edman A. Performed by: anesthesiologist  Preanesthetic Checklist Completed: patient identified, site marked, surgical consent, pre-op evaluation, timeout performed, IV checked, risks and benefits discussed and monitors and equipment checked Spinal Block Patient position: sitting Prep: site prepped and draped and DuraPrep Patient monitoring: heart rate, cardiac monitor, continuous pulse ox and blood pressure Approach: midline Location: L3-4 Injection technique: single-shot Needle Needle type: Sprotte  Needle gauge: 24 G Needle length: 9 cm Needle insertion depth: 5 cm Assessment Sensory level: T4 Additional Notes Patient tolerated procedure well. Adequate sensory level.

## 2012-04-16 NOTE — Anesthesia Preprocedure Evaluation (Signed)
Anesthesia Evaluation  Patient identified by MRN, date of birth, ID band Patient awake    Reviewed: Allergy & Precautions, H&P , NPO status , Patient's Chart, lab work & pertinent test results, reviewed documented beta blocker date and time   History of Anesthesia Complications Negative for: history of anesthetic complications  Airway Mallampati: II TM Distance: >3 FB Neck ROM: full    Dental  (+) Teeth Intact   Pulmonary asthma (last inhaler use one month ago) , Current Smoker,  breath sounds clear to auscultation        Cardiovascular negative cardio ROS  Rhythm:regular Rate:Normal     Neuro/Psych  Headaches (daily headaches), PSYCHIATRIC DISORDERS (anxiety, depression, ADHD, PTSD - no meds currently)    GI/Hepatic negative GI ROS, Neg liver ROS,   Endo/Other  diabetes (noncompliant with meds), Gestational, Oral Hypoglycemic AgentsMorbid obesity  Renal/GU negative Renal ROS  negative genitourinary   Musculoskeletal   Abdominal   Peds  Hematology negative hematology ROS (+)   Anesthesia Other Findings Facial piercing - will remove  Reproductive/Obstetrics (+) Pregnancy (h/o c/s x3)                           Anesthesia Physical Anesthesia Plan  ASA: III  Anesthesia Plan: Spinal   Post-op Pain Management:    Induction:   Airway Management Planned:   Additional Equipment:   Intra-op Plan:   Post-operative Plan:   Informed Consent: I have reviewed the patients History and Physical, chart, labs and discussed the procedure including the risks, benefits and alternatives for the proposed anesthesia with the patient or authorized representative who has indicated his/her understanding and acceptance.     Plan Discussed with: Surgeon and CRNA  Anesthesia Plan Comments:         Anesthesia Quick Evaluation

## 2012-04-16 NOTE — Transfer of Care (Signed)
Immediate Anesthesia Transfer of Care Note  Patient: Kathleen Collins  Procedure(s) Performed: Procedure(s) (LRB) with comments: CESAREAN SECTION WITH BILATERAL TUBAL LIGATION (Bilateral) - Repeat  Patient Location: PACU  Anesthesia Type: Spinal  Level of Consciousness: awake  Airway & Oxygen Therapy: Patient Spontanous Breathing  Post-op Assessment: Report given to PACU RN and Post -op Vital signs reviewed and stable  Post vital signs: stable  Complications: No apparent anesthesia complications

## 2012-04-16 NOTE — Progress Notes (Signed)
UR chart review completed.  

## 2012-04-16 NOTE — MAU Note (Signed)
Pt arrived at 0618 for scheduled C-section. I had called for her in the lobby several times in order to draw blood for scheduled surgery, per MD request. Pt to short-stay, orders for type and crossmatch obtained from Dr. Rodman Pickle.

## 2012-04-17 LAB — CBC
HCT: 27.4 % — ABNORMAL LOW (ref 36.0–46.0)
Hemoglobin: 9.4 g/dL — ABNORMAL LOW (ref 12.0–15.0)
MCH: 28.2 pg (ref 26.0–34.0)
MCHC: 34.3 g/dL (ref 30.0–36.0)
MCV: 82.3 fL (ref 78.0–100.0)
Platelets: 161 K/uL (ref 150–400)
RBC: 3.33 MIL/uL — ABNORMAL LOW (ref 3.87–5.11)
RDW: 13.6 % (ref 11.5–15.5)
WBC: 6.3 K/uL (ref 4.0–10.5)

## 2012-04-17 LAB — GLUCOSE, CAPILLARY
Glucose-Capillary: 86 mg/dL (ref 70–99)
Glucose-Capillary: 147 mg/dL — ABNORMAL HIGH (ref 70–99)

## 2012-04-17 LAB — CCBB MATERNAL DONOR DRAW

## 2012-04-17 LAB — GLUCOSE, RANDOM: Glucose, Bld: 77 mg/dL (ref 70–99)

## 2012-04-17 NOTE — Progress Notes (Signed)
Subjective: Postpartum Day 1: Cesarean Delivery Patient reports tolerating PO and no problems voiding.  Good relief from pain meds  Objective: Vital signs in last 24 hours: Temp:  [98.3 F (36.8 C)-98.7 F (37.1 C)] 98.3 F (36.8 C) (10/19 0810) Pulse Rate:  [64-78] 64  (10/19 0810) Resp:  [18] 18  (10/19 0810) BP: (96-130)/(46-68) 108/55 mmHg (10/19 0810) SpO2:  [98 %] 98 % (10/18 1744)  Physical Exam:  General: alert, cooperative, appears stated age, no distress and moderately obese Lochia: appropriate Uterine Fundus: firm Incision: dressing dry DVT Evaluation: No evidence of DVT seen on physical exam.   Basename 04/17/12 0630  HGB 9.4*  HCT 27.4*    Assessment/Plan: Status post Cesarean section and Tubal ligation. Doing well postoperatively.  Continue current care. Asymptomatic anemia Othostatic BPs  Teofilo Lupinacci P 04/17/2012, 2:08 PM

## 2012-04-17 NOTE — Clinical Social Work Note (Signed)
CSW spoke with MOB about hx of anxiety and depression.  MOB reports she currently sees a therapist at [facility] and is not currently on medication management, however checks in with her therapist monthly.  MOB does not report any symptoms while being in the hospital.    Patient was referred for history of depression/anxiety.  * Referral screened out by Clinical Social Worker because none of the following criteria appear to apply: ~ History of anxiety/depression during this pregnancy, or of post-partum depression. ~ Diagnosis of anxiety and/or depression within last 3 years ~ History of depression due to pregnancy loss/loss of child  OR  * Patient's symptoms currently being treated with medication and/or therapy.  Please contact the Clinical Social Worker if needs arise, or by the patient's request. [phone] 

## 2012-04-18 LAB — GLUCOSE, CAPILLARY
Glucose-Capillary: 73 mg/dL (ref 70–99)
Glucose-Capillary: 98 mg/dL (ref 70–99)

## 2012-04-18 MED ORDER — OXYCODONE-ACETAMINOPHEN 5-325 MG PO TABS
2.0000 | ORAL_TABLET | ORAL | Status: DC | PRN
  Administered 2012-04-18 – 2012-04-19 (×5): 2 via ORAL
  Filled 2012-04-18 (×7): qty 2

## 2012-04-18 NOTE — Progress Notes (Signed)
Patient ID: Kathleen Collins, female   DOB: December 12, 1985, 26 y.o.   MRN: [id number] Subjective: Postpartum Day 2: Cesarean Delivery Patient reports no nausea, vomiting,  Is tolerating PO and no problems voiding.    Objective: Vital signs in last 24 hours: Temp:  [98.1 F (36.7 C)-98.6 F (37 C)] 98.6 F (37 C) (10/20 0520) Pulse Rate:  [68-87] 69  (10/20 0520) Resp:  [18] 18  (10/20 0520) BP: (104-115)/(52-78) 108/52 mmHg (10/20 0520) No orthostatic changes in pulse Physical Exam:  General: alert, cooperative, appears stated age, no distress and moderately obese Lochia: appropriate Uterine Fundus: firm Incision: dry dressing DVT Evaluation: No evidence of DVT seen on physical exam.   Basename 04/17/12 0630  HGB 9.4*  HCT 27.4*   Results for orders placed during the hospital encounter of 04/16/12 (from the past 24 hour(s))  GLUCOSE, CAPILLARY     Status: Normal   Collection Time   04/17/12 12:02 PM      Component Value Range   Glucose-Capillary 86  70 - 99 mg/dL  GLUCOSE, CAPILLARY     Status: Abnormal   Collection Time   04/17/12  6:08 PM      Component Value Range   Glucose-Capillary 147 (*) 70 - 99 mg/dL  GLUCOSE, CAPILLARY     Status: Abnormal   Collection Time   04/17/12  6:11 PM      Component Value Range   Glucose-Capillary 133 (*) 70 - 99 mg/dL  GLUCOSE, CAPILLARY     Status: Abnormal   Collection Time   04/17/12 11:47 PM      Component Value Range   Glucose-Capillary 141 (*) 70 - 99 mg/dL  GLUCOSE, CAPILLARY     Status: Normal   Collection Time   04/18/12  5:33 AM      Component Value Range   Glucose-Capillary 73  70 - 99 mg/dL    Assessment/ Status post Cesarean section. Doing well postoperatively.  GDM with nl FBS,  But some postprandial hyperglycemia.  Plan. Advised patient on continuing modified carbohydrate diet for risk of type 2 diabetes Decrease frequency of pain medication Plan for discharge in a.m.  [name] P 04/18/2012, 9:31  AM

## 2012-04-19 DIAGNOSIS — Z9851 Tubal ligation status: Secondary | ICD-10-CM

## 2012-04-19 DIAGNOSIS — Z98891 History of uterine scar from previous surgery: Secondary | ICD-10-CM | POA: Diagnosis not present

## 2012-04-19 LAB — GLUCOSE, CAPILLARY: Glucose-Capillary: 145 mg/dL — ABNORMAL HIGH (ref 70–99)

## 2012-04-19 LAB — TYPE AND SCREEN
ABO/RH(D): B POS
Antibody Screen: NEGATIVE
Unit division: 0

## 2012-04-19 MED ORDER — POLYSACCHARIDE IRON COMPLEX 150 MG PO CAPS: 150.0000 mg | ORAL_CAPSULE | Freq: Two times a day (BID) | ORAL | Status: DC

## 2012-04-19 MED ORDER — IBUPROFEN 600 MG PO TABS: 600.0000 mg | ORAL_TABLET | Freq: Four times a day (QID) | ORAL | Status: DC | PRN

## 2012-04-19 MED ORDER — OXYCODONE-ACETAMINOPHEN 5-325 MG PO TABS: 2.0000 | ORAL_TABLET | ORAL | Status: DC | PRN

## 2012-04-19 NOTE — Discharge Summary (Signed)
Obstetric Discharge Summary Reason for Admission: cesarean section, repeat with BTL, gestational diabetes Prenatal Procedures: NST and ultrasound Intrapartum Procedures: cesarean: low cervical, transverse, with BTL Postpartum Procedures: none Complications-Operative and Postpartum: none Hemoglobin  Date Value Range Status  04/17/2012 9.4* 12.0 - 15.0 g/dL Final     HCT  Date Value Range Status  04/17/2012 27.4* 36.0 - 46.0 % Final   Hospital Course: Patient was admitted on 04/16/12 for a scheduled repeat cesarean delivery and BTL.   She was taken to the operating room, where Dr. Pennie Rushing performed a repeat LTCS and BTL under spinal anesthesia, with delivery of a viable female, with weight and Apgars as listed below. Infant was in good condition and remained at the patient's bedside.  The patient was taken to recovery in good condition.  Patient planned to bottle feed.  On post-op day 1, patient was doing well, tolerating a regular diet, with Hgb of 9.4.  Throughout her stay, her physical exam was WNL, her incision was CDI, and her vital signs remained stable.  By post-op day 3, she was up ad lib, tolerating a regular diet, with good pain control with po med.  She was deemed to have received the full benefit of her hospital stay, and was discharged home in stable condition.  CBGs were elevated on pp day 1, but resolved to lower levels on day 2.  She felt tired and slightly weak on day 3, but she had just had pain medication and had not had much protein for breakfast.  She denied syncope or dizziness with ambulation--reviewed Hgb of 11.2 pre-op, 9.4 post op.  Recommended iron supplementation, pushing fluids, and better intake of protein, less carbs and sweets.  Physical Exam:  General: Sleepy--just received pain medication. Lochia: appropriate Uterine Fundus: firm Incision: healing well DVT Evaluation: No evidence of DVT seen on physical exam. Negative Homan's sign.  Discharge Diagnoses: Term  Pregnancy-delivered and gestational diabetes, BTL  Discharge Information: Date: 04/19/2012 Activity: Per [facility] handout Diet: routine Medications: Ibuprofen, Percocet and Niferex Condition: stable Instructions: refer to practice specific booklet Discharge to: home   Newborn Data: Live born female  Birth Weight: 7 lb 0.5 oz (3190 g) APGAR: 9, 9  Home with mother.  Kathleen Collins 04/19/2012, 7:10 AM

## 2012-04-19 NOTE — Progress Notes (Signed)
  RD consulted for nutrition education regarding diabetes.   Lab Results  Component Value Date   HGBA1C 5.5 03/30/2012    RD provided "Carbohydrate Counting for People with Diabetes" handout from the Academy of Nutrition and Dietetics. Discussed different food groups and their effects on blood sugar, emphasizing carbohydrate-containing foods. Provided list of carbohydrates and recommended serving sizes of common foods.  Discussed importance of controlled and consistent carbohydrate intake throughout the day. Provided examples of ways to balance meals/snacks and encouraged intake of high-fiber, whole grain complex carbohydrates.  Pt sleepy during education due to pain medication. She was able to repeat back to me the major education points. She may benefit from further diet education at the [facility] in a few months for a refresher.  Expect good compliance.  Elisabeth Cara M.Odis Luster LDN Neonatal Nutrition Support Specialist Pager [phone]

## 2012-05-18 ENCOUNTER — Telehealth: Payer: Self-pay | Admitting: Obstetrics and Gynecology

## 2012-05-18 NOTE — Telephone Encounter (Signed)
[facility] pt 

## 2012-05-19 ENCOUNTER — Telehealth: Payer: Self-pay | Admitting: Obstetrics and Gynecology

## 2012-05-19 NOTE — Telephone Encounter (Signed)
VM from pt. 05/18/12 at 5 PM.  Requests RF for pain med. Pain has increased. Pt [phone]

## 2012-05-19 NOTE — Telephone Encounter (Signed)
LM FOR PT TO CALL BACK. 

## 2012-05-20 NOTE — Telephone Encounter (Signed)
Kathleen Collins to sched

## 2012-05-26 ENCOUNTER — Ambulatory Visit: Payer: Medicaid Other | Admitting: Obstetrics and Gynecology

## 2012-05-26 ENCOUNTER — Encounter: Payer: Medicaid Other | Admitting: Obstetrics and Gynecology

## 2012-06-07 ENCOUNTER — Encounter (HOSPITAL_COMMUNITY): Payer: Self-pay | Admitting: Emergency Medicine

## 2012-06-07 ENCOUNTER — Emergency Department (HOSPITAL_COMMUNITY)
Admission: EM | Admit: 2012-06-07 | Discharge: 2012-06-07 | Disposition: A | Discharge status: Home / Self Care | Payer: Medicaid Other | Attending: Emergency Medicine | Admitting: Emergency Medicine

## 2012-06-07 DIAGNOSIS — S161XXA Strain of muscle, fascia and tendon at neck level, initial encounter: Secondary | ICD-10-CM

## 2012-06-07 DIAGNOSIS — X503XXA Overexertion from repetitive movements, initial encounter: Secondary | ICD-10-CM | POA: Insufficient documentation

## 2012-06-07 DIAGNOSIS — F909 Attention-deficit hyperactivity disorder, unspecified type: Secondary | ICD-10-CM | POA: Insufficient documentation

## 2012-06-07 DIAGNOSIS — Y92410 Unspecified street and highway as the place of occurrence of the external cause: Secondary | ICD-10-CM | POA: Insufficient documentation

## 2012-06-07 DIAGNOSIS — Z79899 Other long term (current) drug therapy: Secondary | ICD-10-CM | POA: Insufficient documentation

## 2012-06-07 DIAGNOSIS — Z87891 Personal history of nicotine dependence: Secondary | ICD-10-CM | POA: Insufficient documentation

## 2012-06-07 DIAGNOSIS — J45909 Unspecified asthma, uncomplicated: Secondary | ICD-10-CM | POA: Insufficient documentation

## 2012-06-07 DIAGNOSIS — N750 Cyst of Bartholin's gland: Secondary | ICD-10-CM | POA: Insufficient documentation

## 2012-06-07 DIAGNOSIS — M436 Torticollis: Secondary | ICD-10-CM | POA: Insufficient documentation

## 2012-06-07 DIAGNOSIS — Z8659 Personal history of other mental and behavioral disorders: Secondary | ICD-10-CM | POA: Insufficient documentation

## 2012-06-07 DIAGNOSIS — S139XXA Sprain of joints and ligaments of unspecified parts of neck, initial encounter: Secondary | ICD-10-CM | POA: Insufficient documentation

## 2012-06-07 DIAGNOSIS — Y9389 Activity, other specified: Secondary | ICD-10-CM | POA: Insufficient documentation

## 2012-06-07 MED ORDER — OXYCODONE-ACETAMINOPHEN 5-325 MG PO TABS
2.0000 | ORAL_TABLET | Freq: Once | ORAL | Status: AC
  Administered 2012-06-07: 2 via ORAL
  Filled 2012-06-07: qty 2

## 2012-06-07 MED ORDER — CYCLOBENZAPRINE HCL 10 MG PO TABS: 10.0000 mg | ORAL_TABLET | Freq: Two times a day (BID) | ORAL | Status: DC | PRN

## 2012-06-07 MED ORDER — IBUPROFEN 800 MG PO TABS
800.0000 mg | ORAL_TABLET | Freq: Once | ORAL | Status: AC
  Administered 2012-06-07: 800 mg via ORAL
  Filled 2012-06-07: qty 1

## 2012-06-07 MED ORDER — CYCLOBENZAPRINE HCL 10 MG PO TABS
10.0000 mg | ORAL_TABLET | Freq: Once | ORAL | Status: AC
  Administered 2012-06-07: 10 mg via ORAL
  Filled 2012-06-07: qty 1

## 2012-06-07 NOTE — ED Notes (Signed)
C/o vag pain noticed when having sex, hx of bartholin's cyst on right side of labia- this pain is on left. Also c/o stiff neck.
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
## 2012-06-07 NOTE — ED Provider Notes (Signed)
Medical screening examination/treatment/procedure(s) were performed by non-physician practitioner and as supervising physician I was immediately available for consultation/collaboration.  Taylie Helder R. Adriannah Steinkamp, MD 06/07/12 1556 

## 2012-06-09 ENCOUNTER — Telehealth: Payer: Self-pay | Admitting: Obstetrics and Gynecology

## 2012-06-09 NOTE — Telephone Encounter (Signed)
Tc TO pt. To schedule f/u for Bartholian cyst. States has been out of town and will not return until 06/11/12. States gauze is still in place and feels much better. Advised needs to have F/u ASAP.  Also states fell down stairs and is having shoulder pain and cannot move it. ADvised to be seen in ER or [facility] and to have cyst f/u as well if causing any discomfort. Pt verbalizes comprehension.

## 2012-06-11 ENCOUNTER — Encounter: Payer: Medicaid Other | Admitting: Obstetrics and Gynecology

## 2012-10-09 ENCOUNTER — Encounter (HOSPITAL_COMMUNITY): Payer: Self-pay | Admitting: *Deleted

## 2012-10-09 ENCOUNTER — Emergency Department (HOSPITAL_COMMUNITY): Payer: Medicaid Other

## 2012-10-09 ENCOUNTER — Emergency Department (HOSPITAL_COMMUNITY)
Admission: EM | Admit: 2012-10-09 | Discharge: 2012-10-09 | Disposition: A | Discharge status: Home / Self Care | Payer: Medicaid Other | Attending: Emergency Medicine | Admitting: Emergency Medicine

## 2012-10-09 ENCOUNTER — Other Ambulatory Visit (HOSPITAL_COMMUNITY): Payer: Medicaid Other

## 2012-10-09 DIAGNOSIS — S058X9A Other injuries of unspecified eye and orbit, initial encounter: Secondary | ICD-10-CM | POA: Insufficient documentation

## 2012-10-09 DIAGNOSIS — F909 Attention-deficit hyperactivity disorder, unspecified type: Secondary | ICD-10-CM | POA: Insufficient documentation

## 2012-10-09 DIAGNOSIS — S0990XA Unspecified injury of head, initial encounter: Secondary | ICD-10-CM | POA: Insufficient documentation

## 2012-10-09 DIAGNOSIS — F4310 Post-traumatic stress disorder, unspecified: Secondary | ICD-10-CM | POA: Insufficient documentation

## 2012-10-09 DIAGNOSIS — Z79899 Other long term (current) drug therapy: Secondary | ICD-10-CM | POA: Insufficient documentation

## 2012-10-09 DIAGNOSIS — F1010 Alcohol abuse, uncomplicated: Secondary | ICD-10-CM | POA: Insufficient documentation

## 2012-10-09 DIAGNOSIS — M791 Myalgia, unspecified site: Secondary | ICD-10-CM

## 2012-10-09 DIAGNOSIS — Z8632 Personal history of gestational diabetes: Secondary | ICD-10-CM | POA: Insufficient documentation

## 2012-10-09 DIAGNOSIS — S0993XA Unspecified injury of face, initial encounter: Secondary | ICD-10-CM | POA: Insufficient documentation

## 2012-10-09 DIAGNOSIS — Z87891 Personal history of nicotine dependence: Secondary | ICD-10-CM | POA: Insufficient documentation

## 2012-10-09 DIAGNOSIS — S0502XA Injury of conjunctiva and corneal abrasion without foreign body, left eye, initial encounter: Secondary | ICD-10-CM

## 2012-10-09 DIAGNOSIS — IMO0001 Reserved for inherently not codable concepts without codable children: Secondary | ICD-10-CM | POA: Insufficient documentation

## 2012-10-09 DIAGNOSIS — F411 Generalized anxiety disorder: Secondary | ICD-10-CM | POA: Insufficient documentation

## 2012-10-09 DIAGNOSIS — J45909 Unspecified asthma, uncomplicated: Secondary | ICD-10-CM | POA: Insufficient documentation

## 2012-10-09 MED ORDER — TETRACAINE HCL 0.5 % OP SOLN
1.0000 [drp] | Freq: Once | OPHTHALMIC | Status: AC
Start: 2012-10-09 — End: 2012-10-09
  Administered 2012-10-09: 1 [drp] via OPHTHALMIC
  Filled 2012-10-09 (×2): qty 2

## 2012-10-09 MED ORDER — OXYCODONE-ACETAMINOPHEN 5-325 MG PO TABS
2.0000 | ORAL_TABLET | Freq: Once | ORAL | Status: AC
  Administered 2012-10-09: 2 via ORAL
  Filled 2012-10-09: qty 2

## 2012-10-09 MED ORDER — OXYCODONE-ACETAMINOPHEN 5-325 MG PO TABS: 1.0000 | ORAL_TABLET | Freq: Four times a day (QID) | ORAL | Status: DC | PRN

## 2012-10-09 MED ORDER — FLUORESCEIN SODIUM 1 MG OP STRP
1.0000 | ORAL_STRIP | Freq: Once | OPHTHALMIC | Status: AC
  Administered 2012-10-09: 1 via OPHTHALMIC
  Filled 2012-10-09: qty 1

## 2012-10-09 NOTE — Discharge Instructions (Signed)
Corneal Abrasion  The cornea is the clear covering at the front and center of the eye. It is a thin tissue made up of layers. The top layer is the most sensitive layer. A corneal abrasion happens if this layer is scratched or an injury causes it to come off.    HOME CARE   You may be given drops or a medicated cream. Use the medicine as told by your doctor.   A pressure patch may be put over the eye. If this is done, follow your doctor's instructions for when to remove the patch. Do not drive or use machines while the eye patch is on. Judging distances is hard to do with a patch on.   See your doctor for a follow-up exam if you are told to do so.  GET HELP RIGHT AWAY IF:     The pain is getting worse or is very bad.   The eye is very sensitive to light.   Any liquid comes out of the injured eye after treatment.   Your vision suddenly gets worse.   You have a sudden loss of vision or blindness.  MAKE SURE YOU:     Understand these instructions.   Will watch your condition.   Will get help right away if you are not doing well or get worse.  Document Released: 12/03/2007 Document Revised: 09/08/2011 Document Reviewed: 12/03/2007  [employer] Patient Information 2013 [employer].
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
## 2012-10-12 NOTE — ED Provider Notes (Signed)
Medical screening examination/treatment/procedure(s) were performed by non-physician practitioner and as supervising physician I was immediately available for consultation/collaboration.    Chemeka Filice R Rani Idler, MD 10/12/12 0250 

## 2013-04-27 ENCOUNTER — Encounter (HOSPITAL_COMMUNITY): Payer: Self-pay | Admitting: Emergency Medicine

## 2013-04-27 ENCOUNTER — Emergency Department (HOSPITAL_COMMUNITY)
Admission: EM | Admit: 2013-04-27 | Discharge: 2013-04-28 | Disposition: A | Discharge status: Home / Self Care | Payer: Medicaid Other | Attending: Emergency Medicine | Admitting: Emergency Medicine

## 2013-04-27 DIAGNOSIS — N949 Unspecified condition associated with female genital organs and menstrual cycle: Secondary | ICD-10-CM | POA: Insufficient documentation

## 2013-04-27 DIAGNOSIS — R7309 Other abnormal glucose: Secondary | ICD-10-CM | POA: Insufficient documentation

## 2013-04-27 DIAGNOSIS — M542 Cervicalgia: Secondary | ICD-10-CM | POA: Insufficient documentation

## 2013-04-27 DIAGNOSIS — Z3202 Encounter for pregnancy test, result negative: Secondary | ICD-10-CM | POA: Insufficient documentation

## 2013-04-27 DIAGNOSIS — R739 Hyperglycemia, unspecified: Secondary | ICD-10-CM

## 2013-04-27 DIAGNOSIS — N926 Irregular menstruation, unspecified: Secondary | ICD-10-CM | POA: Insufficient documentation

## 2013-04-27 DIAGNOSIS — Z8659 Personal history of other mental and behavioral disorders: Secondary | ICD-10-CM | POA: Insufficient documentation

## 2013-04-27 DIAGNOSIS — Z8632 Personal history of gestational diabetes: Secondary | ICD-10-CM | POA: Insufficient documentation

## 2013-04-27 DIAGNOSIS — Z87891 Personal history of nicotine dependence: Secondary | ICD-10-CM | POA: Insufficient documentation

## 2013-04-27 DIAGNOSIS — R109 Unspecified abdominal pain: Secondary | ICD-10-CM | POA: Insufficient documentation

## 2013-04-27 DIAGNOSIS — Z79899 Other long term (current) drug therapy: Secondary | ICD-10-CM | POA: Insufficient documentation

## 2013-04-27 DIAGNOSIS — R197 Diarrhea, unspecified: Secondary | ICD-10-CM | POA: Insufficient documentation

## 2013-04-27 DIAGNOSIS — G8929 Other chronic pain: Secondary | ICD-10-CM | POA: Insufficient documentation

## 2013-04-27 DIAGNOSIS — M545 Low back pain, unspecified: Secondary | ICD-10-CM | POA: Insufficient documentation

## 2013-04-27 DIAGNOSIS — J45909 Unspecified asthma, uncomplicated: Secondary | ICD-10-CM | POA: Insufficient documentation

## 2013-04-27 DIAGNOSIS — N898 Other specified noninflammatory disorders of vagina: Secondary | ICD-10-CM

## 2013-04-27 LAB — WET PREP, GENITAL
Clue Cells Wet Prep HPF POC: NONE SEEN
Trich, Wet Prep: NONE SEEN

## 2013-04-27 LAB — URINALYSIS, ROUTINE W REFLEX MICROSCOPIC
Bilirubin Urine: NEGATIVE
Ketones, ur: NEGATIVE mg/dL
Nitrite: NEGATIVE
Protein, ur: NEGATIVE mg/dL
pH: 6 (ref 5.0–8.0)

## 2013-04-27 LAB — CBC WITH DIFFERENTIAL/PLATELET
Basophils Absolute: 0 10*3/uL (ref 0.0–0.1)
Eosinophils Absolute: 0.2 10*3/uL (ref 0.0–0.7)
Eosinophils Relative: 3 % (ref 0–5)
MCH: 26.5 pg (ref 26.0–34.0)
MCV: 80.3 fL (ref 78.0–100.0)
Platelets: 207 10*3/uL (ref 150–400)
RDW: 14.5 % (ref 11.5–15.5)
WBC: 6.8 10*3/uL (ref 4.0–10.5)

## 2013-04-27 LAB — COMPREHENSIVE METABOLIC PANEL
ALT: 16 U/L (ref 0–35)
AST: 18 U/L (ref 0–37)
Calcium: 9.2 mg/dL (ref 8.4–10.5)
Sodium: 132 mEq/L — ABNORMAL LOW (ref 135–145)
Total Protein: 6.8 g/dL (ref 6.0–8.3)
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
## 2013-04-27 NOTE — ED Provider Notes (Signed)
CSN: [id number]     Arrival date & time 04/27/13  1722 History   First MD Initiated Contact with Patient 04/27/13 2029     Chief Complaint  Patient presents with  . Back Pain  . Abdominal Pain  . Vaginal Discharge    HPI  Kathleen Collins is a 27 y.o. female with a PMH of ADHD, PTSD, gestational diabetes, anxiety, depression, and asthma who presents to the ED for evaluation of back pain, abdominal pain, and vaginal discharge.  History was provided by the patient.  Patient states that she has chronic lower back pain.  She lived in [place] and was being managed for her back pain with physical therapy, Vicodin, and flexeril.  She sat in a chair which broke and fell on her butt bones and had worsening of her lower back pain in early August in [place].  She denies any new injuries or trauma.  She states that she recently moved to [facility] and has not yet established care or had physical therapy for "months."  She denies any acute changes in her back pain, loss of bowel/bladder function, weakness, or loss of sensation.  She intermittently gets numbness and tingling but denies this currently.  Her pain is located in the lower back diffusely with the left greater in the right and is described as an aching sensation.  She intermittently gets episodes of sharp pain which radiates up her spine to her neck.  She also has middle lower abdominal pain for a few weeks.  She also has noticed a yellow discharge with a foul odor.  She is currently sexually active.  She denies any new partners.  Her LNMP was the end of September but her menstrual cycles have been very irregular.  She denies any vaginal bleeding, genital sores or pain.  She has had intermittent nausea with no emesis.  She has had diarrhea for the past 24 hours which is described as watery and brown.  No hematochezia.  She denies any sick contacts or recent travel.  Previous abdominal surgeries include c-section.  She denies any fevers, chills, cough, chest pain,  SOB, headache, dizziness, lightheadedness, or leg edema.     Past Medical History  Diagnosis Date  . ADHD (attention deficit hyperactivity disorder)   . ADHD (attention deficit hyperactivity disorder)   . PTSD (post-traumatic stress disorder)   . ADHD (attention deficit hyperactivity disorder)   . Gestational diabetes     rx given but patient states "does not take the med"  . Anxiety     no meds currently  . Depression     currently no meds  . Asthma     rarely use rescue inhaler    Past Surgical History  Procedure Laterality Date  . Cesarean section      x 3  . Tonsillectomy    . Addnoidectomy    . Cesarean section  02/10/2011    Procedure: CESAREAN SECTION;  Surgeon: Esmeralda Arthur, MD;  Location: [facility];  Service: Gynecology;  Laterality: N/A;  Repeat  . Cyst on kidney  2009    hx  . Bipolor      no meds currently  . Tubal ligation     Family History  Problem Relation Age of Onset  . Hypertension Mother   . Diabetes Mother   . Seizures Mother   . Migraines Mother   . Stroke Mother   . Autoimmune disease Mother   . Hypertension Father   . Thrombophlebitis Father   .  Diabetes Father   . Seizures Father   . Autoimmune disease Father   . Asthma Brother   . Asthma Son   . Autism Son   . Diabetes Paternal Aunt   . Heart disease Maternal Grandmother   . Hypertension Maternal Grandmother   . Schizophrenia Maternal Grandmother   . Hypertension Maternal Grandfather   . Diabetes Maternal Grandfather   . Hypertension Paternal Grandmother   . Hypertension Paternal Grandfather   . Thrombophlebitis Paternal Grandfather   . Diabetes Paternal Grandfather   . Autoimmune disease Paternal Grandfather    History  Substance Use Topics  . Smoking status: Former Smoker -- 0.25 packs/day for 6 years    Types: Cigarettes    Quit date: 03/17/2012  . Smokeless tobacco: Never Used  . Alcohol Use: No   OB History   Grav Para Term Preterm Abortions TAB SAB Ect Mult Living    4 4 4  0 0 0 0 0 0 4     Review of Systems  Constitutional: Negative for fever, chills, diaphoresis, activity change, appetite change and fatigue.  HENT: Negative for congestion, mouth sores, rhinorrhea and sore throat.   Eyes: Negative for photophobia and visual disturbance.  Respiratory: Negative for cough and shortness of breath.   Cardiovascular: Negative for chest pain and leg swelling.  Gastrointestinal: Positive for nausea (intermittent), abdominal pain and diarrhea. Negative for vomiting, constipation, blood in stool and rectal pain.  Genitourinary: Positive for vaginal discharge, menstrual problem (irregular menses) and pelvic pain. Negative for dysuria, hematuria, flank pain, decreased urine volume, vaginal bleeding, difficulty urinating, genital sores, vaginal pain and dyspareunia.  Musculoskeletal: Positive for back pain and neck pain. Negative for arthralgias, gait problem, joint swelling, myalgias and neck stiffness.  Skin: Negative for color change, pallor and wound.  Neurological: Positive for numbness (intermittent). Negative for dizziness, syncope, weakness, light-headedness and headaches.    Allergies  Review of patient's allergies indicates no known allergies.  Home Medications   Current Outpatient Rx  Name  Route  Sig  Dispense  Refill  . albuterol (PROVENTIL HFA;VENTOLIN HFA) 108 (90 BASE) MCG/ACT inhaler   Inhalation   Inhale 1-2 puffs into the lungs every 6 (six) hours as needed. For wheezing/asthma. PRN ONLY!!         . cyclobenzaprine (FLEXERIL) 10 MG tablet   Oral   Take 10 mg by mouth 3 (three) times daily as needed for muscle spasms.          BP 137/77  Pulse 85  Temp(Src) 98.1 F (36.7 C) (Oral)  Resp 16  SpO2 99%  LMP 03/28/2013  Filed Vitals:   04/27/13 1752 04/27/13 2354  BP: 137/77 137/83  Pulse: 85 59  Temp: 98.1 F (36.7 C) 98.1 F (36.7 C)  TempSrc: Oral Oral  Resp: 16 20  SpO2: 99% 100%    Physical Exam  Nursing note and  vitals reviewed. Constitutional: She is oriented to person, place, and time. She appears well-developed and well-nourished. No distress.  HENT:  Head: Normocephalic and atraumatic.  Right Ear: External ear normal.  Left Ear: External ear normal.  Nose: Nose normal.  Mouth/Throat: Oropharynx is clear and moist. No oropharyngeal exudate.  Eyes: Conjunctivae are normal. Pupils are equal, round, and reactive to light. Right eye exhibits no discharge. Left eye exhibits no discharge.  Neck: Normal range of motion. Neck supple.  Cardiovascular: Normal rate, regular rhythm, normal heart sounds and intact distal pulses.  Exam reveals no gallop and no friction rub.  No murmur heard. Dorsalis pedis pulses present bilaterally  Pulmonary/Chest: Effort normal and breath sounds normal. No respiratory distress. She has no wheezes. She has no rales. She exhibits no tenderness.  Abdominal: Soft. Bowel sounds are normal. She exhibits no distension and no mass. There is tenderness. There is no rebound and no guarding.  Mild middle lower abdominal/pelvic pain  Musculoskeletal: Normal range of motion. She exhibits tenderness. She exhibits no edema.  No thoracic or lumbar spinal tenderness.  Mild lower lumbar paraspinal tenderness bilaterally.  No CVA tenderness bilaterally.  Patient able to ambulate without difficulty or ataxia.  Strength 5/5 in the lower extremities bilaterally.  No pedal edema bilaterally  Neurological: She is alert and oriented to person, place, and time.  Sensation intact in the LE bilaterally.    Skin: Skin is warm and dry. She is not diaphoretic.    ED Course  Procedures (including critical care time) Labs Review Labs Reviewed  URINALYSIS, ROUTINE W REFLEX MICROSCOPIC   Imaging Review No results found.  EKG Interpretation   None      Results for orders placed during the hospital encounter of 04/27/13  WET PREP, GENITAL      Result Value Range   Yeast Wet Prep HPF POC NONE  SEEN  NONE SEEN   Trich, Wet Prep NONE SEEN  NONE SEEN   Clue Cells Wet Prep HPF POC NONE SEEN  NONE SEEN   WBC, Wet Prep HPF POC FEW (*) NONE SEEN  GC/CHLAMYDIA PROBE AMP      Result Value Range   CT Probe RNA POSITIVE (*) NEGATIVE   GC Probe RNA NEGATIVE  NEGATIVE  URINALYSIS, ROUTINE W REFLEX MICROSCOPIC      Result Value Range   Color, Urine YELLOW  YELLOW   APPearance CLEAR  CLEAR   Specific Gravity, Urine 1.026  1.005 - 1.030   pH 6.0  5.0 - 8.0   Glucose, UA NEGATIVE  NEGATIVE mg/dL   Hgb urine dipstick NEGATIVE  NEGATIVE   Bilirubin Urine NEGATIVE  NEGATIVE   Ketones, ur NEGATIVE  NEGATIVE mg/dL   Protein, ur NEGATIVE  NEGATIVE mg/dL   Urobilinogen, UA 0.2  0.0 - 1.0 mg/dL   Nitrite NEGATIVE  NEGATIVE   Leukocytes, UA NEGATIVE  NEGATIVE  RPR      Result Value Range   RPR NON REACTIVE  NON REACTIVE  HIV ANTIBODY (ROUTINE TESTING)      Result Value Range   HIV NON REACTIVE  NON REACTIVE  CBC WITH DIFFERENTIAL      Result Value Range   WBC 6.8  4.0 - 10.5 K/uL   RBC 4.42  3.87 - 5.11 MIL/uL   Hemoglobin 11.7 (*) 12.0 - 15.0 g/dL   HCT 16.1 (*) 09.6 - 04.5 %   MCV 80.3  78.0 - 100.0 fL   MCH 26.5  26.0 - 34.0 pg   MCHC 33.0  30.0 - 36.0 g/dL   RDW 40.9  81.1 - 91.4 %   Platelets 207  150 - 400 K/uL   Neutrophils Relative % 54  43 - 77 %   Neutro Abs 3.7  1.7 - 7.7 K/uL   Lymphocytes Relative 38  12 - 46 %   Lymphs Abs 2.6  0.7 - 4.0 K/uL   Monocytes Relative 5  3 - 12 %   Monocytes Absolute 0.4  0.1 - 1.0 K/uL   Eosinophils Relative 3  0 - 5 %   Eosinophils Absolute 0.2  0.0 -  0.7 K/uL   Basophils Relative 0  0 - 1 %   Basophils Absolute 0.0  0.0 - 0.1 K/uL  COMPREHENSIVE METABOLIC PANEL      Result Value Range   Sodium 132 (*) 135 - 145 mEq/L   Potassium 4.0  3.5 - 5.1 mEq/L   Chloride 100  96 - 112 mEq/L   CO2 25  19 - 32 mEq/L   Glucose, Bld 193 (*) 70 - 99 mg/dL   BUN 9  6 - 23 mg/dL   Creatinine, Ser 1.61  0.50 - 1.10 mg/dL   Calcium 9.2  8.4 - 09.6  mg/dL   Total Protein 6.8  6.0 - 8.3 g/dL   Albumin 3.4 (*) 3.5 - 5.2 g/dL   AST 18  0 - 37 U/L   ALT 16  0 - 35 U/L   Alkaline Phosphatase 47  39 - 117 U/L   Total Bilirubin <0.1 (*) 0.3 - 1.2 mg/dL   GFR calc non Af Amer 82 (*) >90 mL/min   GFR calc Af Amer >90  >90 mL/min  POCT PREGNANCY, URINE      Result Value Range   Preg Test, Ur NEGATIVE  NEGATIVE    MDM   1. Back pain, chronic   2. Abdominal pain   3. Hyperglycemia   4. Vaginal discharge     Maila Dukes is a 27 y.o. female with a PMH of ADHD, PTSD, gestational diabetes, anxiety, depression, and asthma who presents to the ED for evaluation of back pain, abdominal pain, and vaginal discharge.  History was provided by the patient.  Patient states that she has chronic lower back pain.  CBC, CMP, urine pregnancy, and UA ordered.     Rechecks  9:20 PM = Pelvic exam performed at bedside with staff present.  Mild amount of thin white vaginal discharge present in the vaginal vault.  No CMT or adnexal tenderness.  Wet mount and GC probe sent to the lab.  Spoke with patient about further testing and patient would like HIV and RPR testing.  Repeat abdominal exam performed.  No change.   11:55 PM = Patient eating a brownie when I entered the room.  She states she feels better.  She denies any pain.  Repeat abdominal exam benign.  Spoke with patient about results.  Patient states she had gestational diabetes, but has not been diagnosed with DM.  I discussed diet, exercise, and answered questions about diabetes.  Patient feels ready for discharge.     Abdominal pain unclear.  Patient's pain resolved throughout her ED visit.  She was non-toxic in appearance, afebrile, and remained in no acute distress.  Her glucose was elevated at 193.  She has a history of gestational diabetes in the past.  She was not started on diabetes medications at this time, but instructed to follow-up with her PCP as soon as possible.  Her urine was not suggestive  of a UTI at this time.  Back pain appears to be chronic in nature with no recent changes or injuries/trauma.  Patient was neurovascularly intact.  Patient instructed to follow-up with her PCP and OB/GYN.  Patient instructed to return to the ED if she has any changing or worsening abdominal pain, fever, repeated vomiting, lightheadedness, dizziness, loss of bowel/bladder function, or other concerns.  Patient in agreement with discharge and plan.  Patient in ED with her friend who is also a patient.     Final impressions: 1. Abdominal pain  2.  Back pain, chronic  3. Hyperglycemia  4. Vaginal discharge     Luiz Iron PA-C   This patient was discussed with Dr. Clayborne Dana, PA-C 04/29/13 1013

## 2013-04-27 NOTE — ED Notes (Signed)
Pt called in waiting room, no answer, will try again later

## 2013-04-28 LAB — GC/CHLAMYDIA PROBE AMP: CT Probe RNA: POSITIVE — AB

## 2013-04-28 LAB — RPR: RPR Ser Ql: NONREACTIVE

## 2013-04-29 NOTE — ED Provider Notes (Signed)
Medical screening examination/treatment/procedure(s) were performed by non-physician practitioner and as supervising physician I was immediately available for consultation/collaboration.    Terrel Nesheiwat L Batsheva Stevick, MD 04/29/13 1215 

## 2013-04-29 NOTE — ED Notes (Signed)
+   Chlamydia Patient called for results and was informed of positive results and states she wants to return to be treated.

## 2013-05-05 ENCOUNTER — Other Ambulatory Visit: Payer: Self-pay

## 2013-05-07 NOTE — ED Notes (Addendum)
Chart returned from EDP office .Order written by Lemont Fillers for Azithromycin 1 gram po x 1 dose need to be called to Pharmacy.Patient informed of positive results after id'd x 2 and informed of need to notify partner to be treated.Patient requests that rx be called to [facility] [phone]

## 2013-05-11 ENCOUNTER — Telehealth (HOSPITAL_COMMUNITY): Payer: Self-pay | Admitting: Emergency Medicine

## 2013-05-11 NOTE — ED Notes (Signed)
Rx for Zithromax 250 mg, #4, take 4 tabs PO once called to [facility] [phone]). Rx prescribed by Jaynie Crumble PA-C.

## 2013-06-19 IMAGING — CT CT HEAD W/O CM
1 series · 16 of 30 positions shown, 20 images · non-contrast
Comparison: None.

CLINICAL DATA: Headache

CT HEAD WITHOUT CONTRAST
TECHNIQUE: Contiguous axial images were obtained from the base of
the skull through the vertex without contrast

[Series 2: (id) head 4.8 h37s st · axial · 0.54mm/px · z∈[-123,+15]mm · 16 of 30 slices shown, 20 images]
[im 2/30  brain]
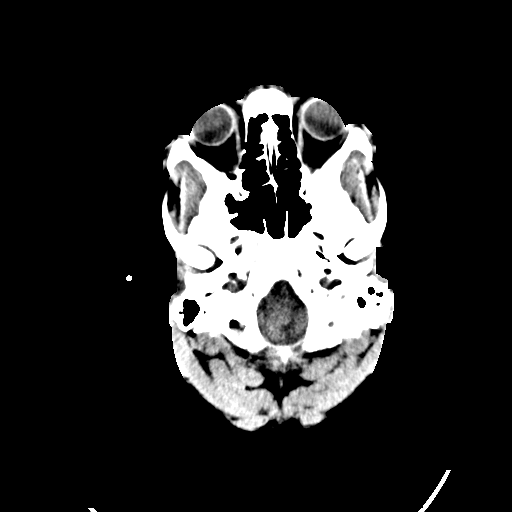
[im 2/30  bone]
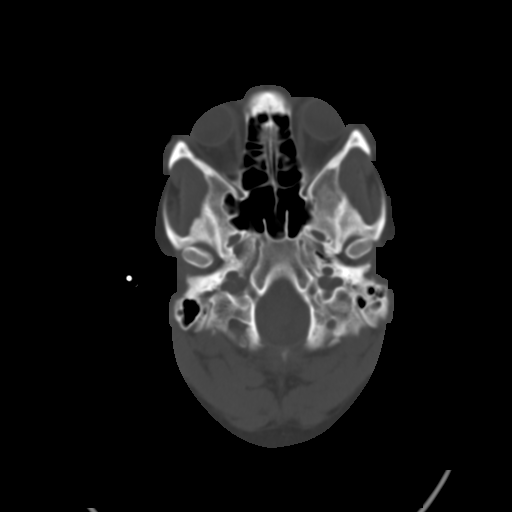
[im 4/30  brain]
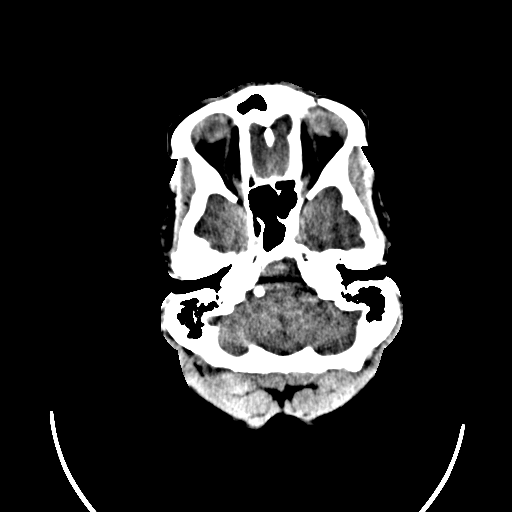
[im 6/30  brain]
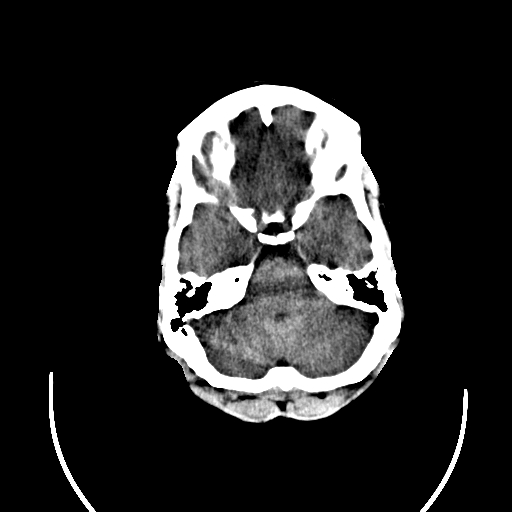
[im 8/30  brain]
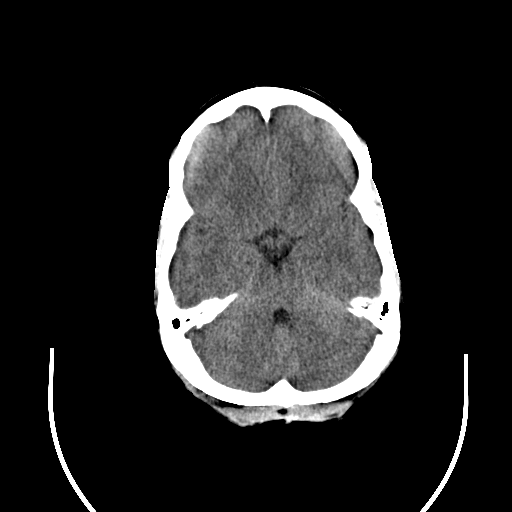
[im 9/30  brain]
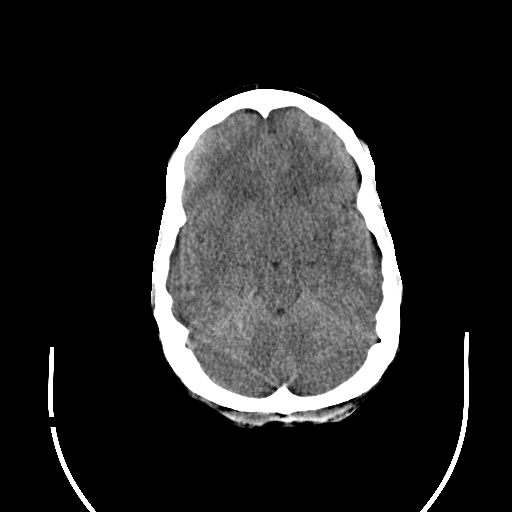
[im 9/30  bone]
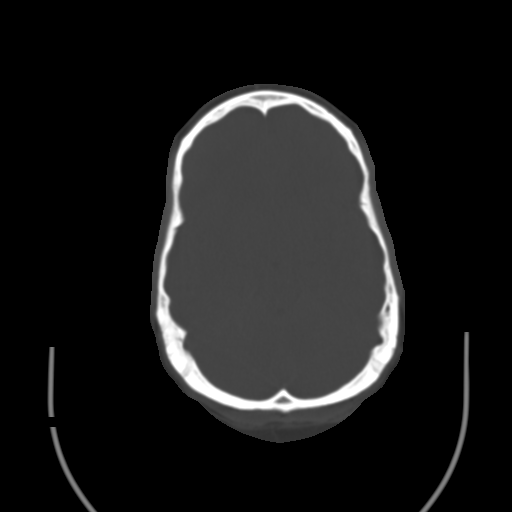
[im 11/30  brain]
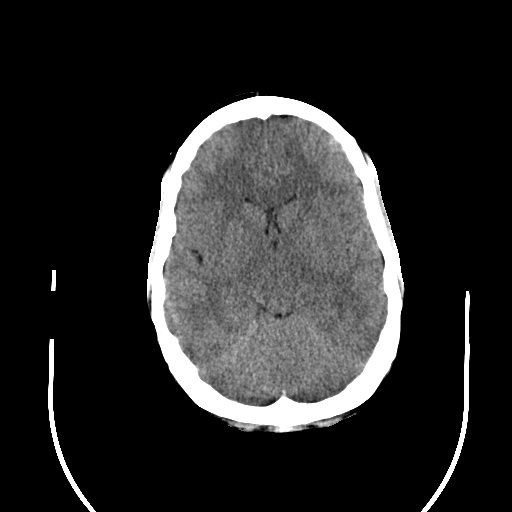
[im 13/30  brain]
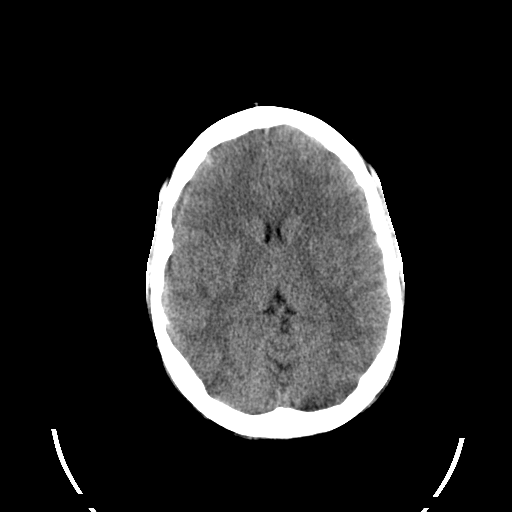
[im 15/30  brain]
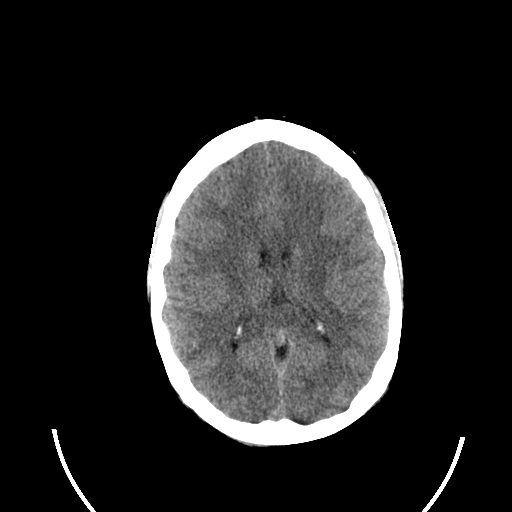
[im 16/30  brain]
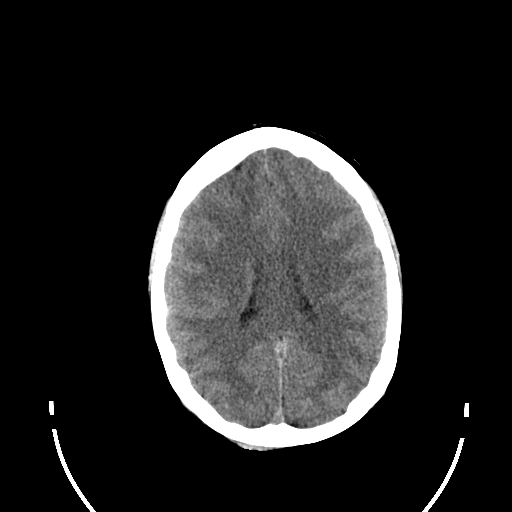
[im 16/30  bone]
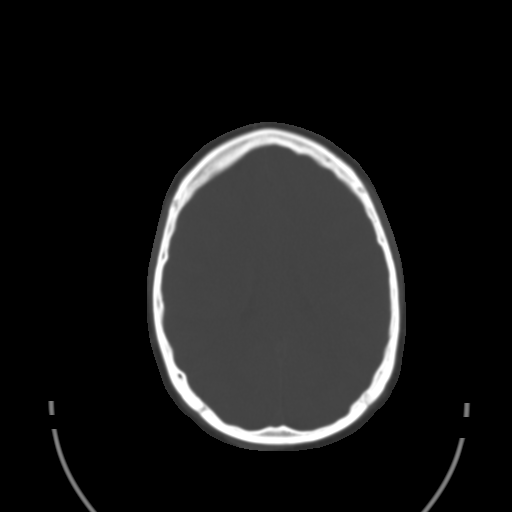
[im 18/30  brain]
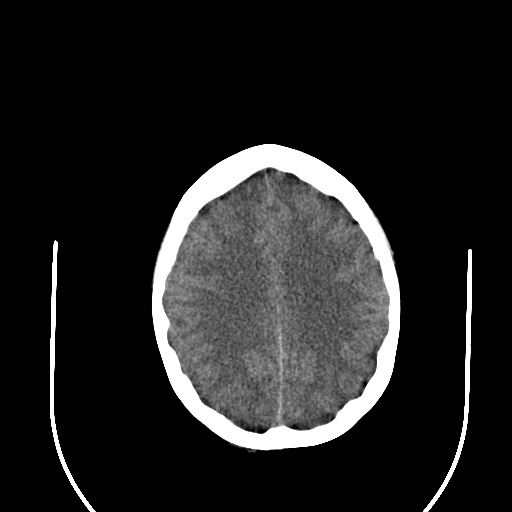
[im 20/30  brain]
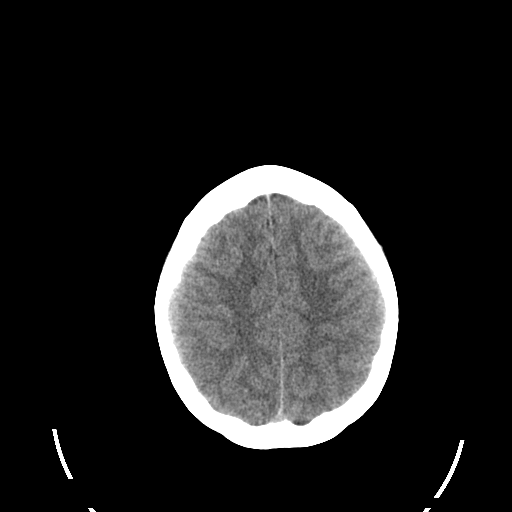
[im 22/30  brain]
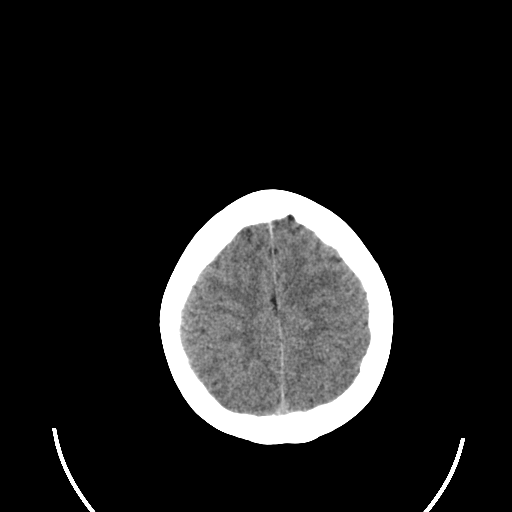
[im 23/30  brain]
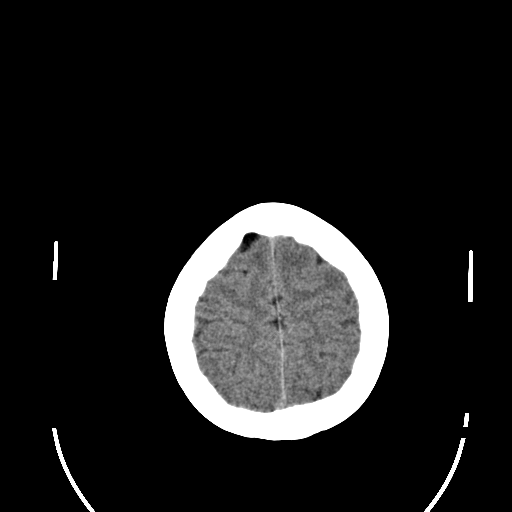
[im 23/30  bone]
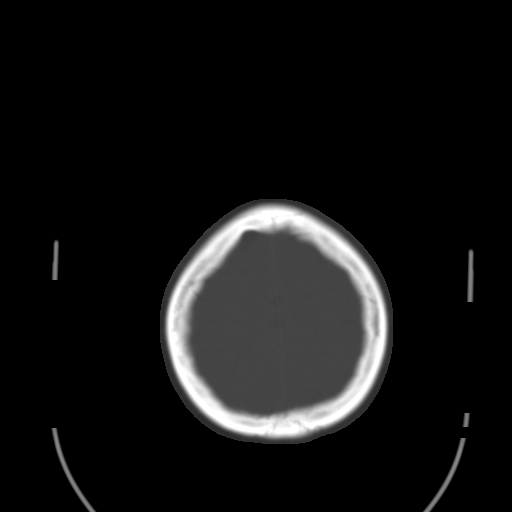
[im 25/30  brain]
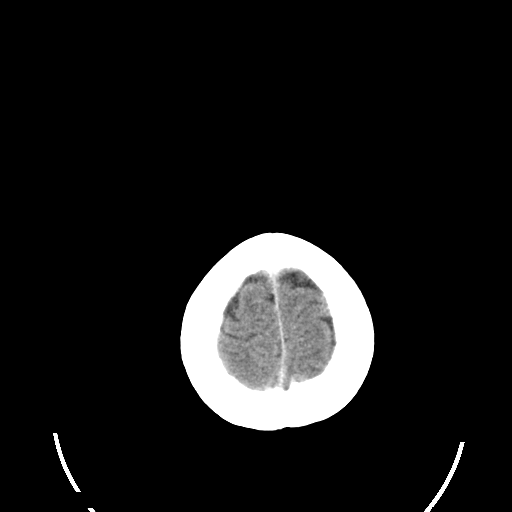
[im 27/30  brain]
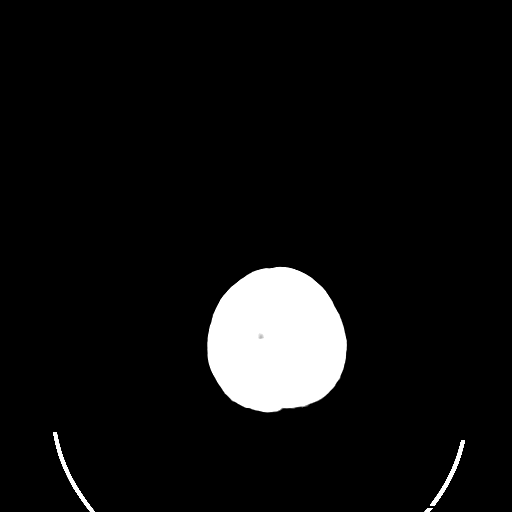
[im 29/30  brain]
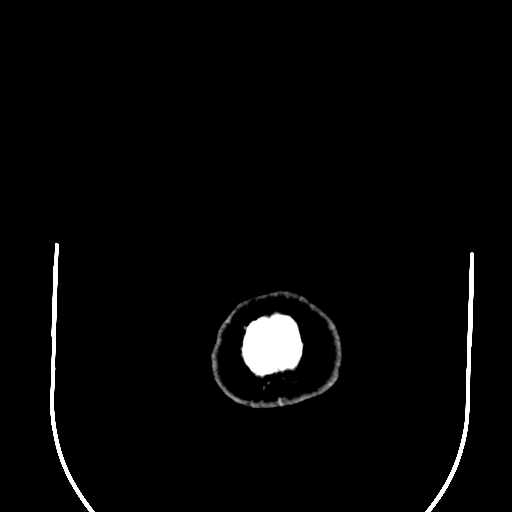

[16 of 30 positions shown; findings below may reference images not displayed]

FINDINGS: The brain has a normal appearance without evidence for
hemorrhage, acute infarction, hydrocephalus, or mass lesion.  There
is no extra axial fluid collection.  The skull and paranasal
sinuses are normal.
IMPRESSION: Normal CT of the head without contrast.

## 2013-07-22 IMAGING — US US PELVIS COMPLETE
1 series · 13 of 25 positions shown · non-contrast
Comparison: None.

CLINICAL DATA: Abdominal and pelvic pain.

TRANSABDOMINAL AND TRANSVAGINAL ULTRASOUND OF PELVIS
TECHNIQUE: Both transabdominal and transvaginal ultrasound
examinations of the pelvis were performed. Transabdominal technique
was performed for global imaging of the pelvis including uterus,
ovaries, adnexal regions, and pelvic cul-de-sac.

[Series 1: us pelvis complete · 0.27mm/px · 13 of 39 slices shown]
[im 1/39]
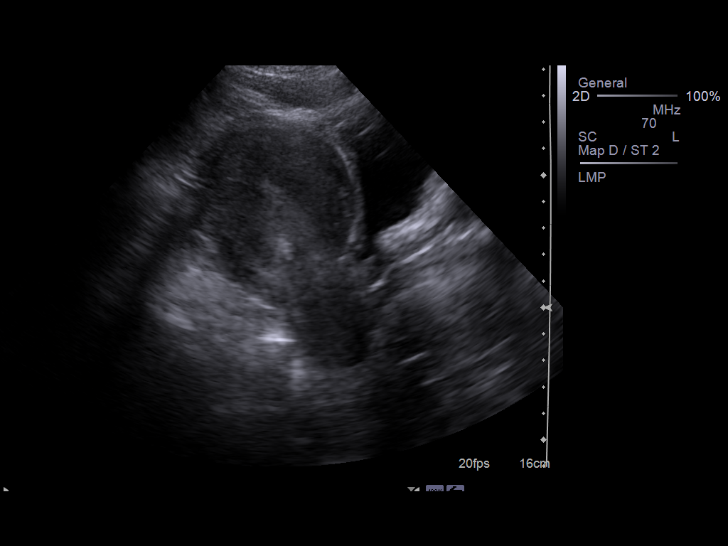
[im 4/39]
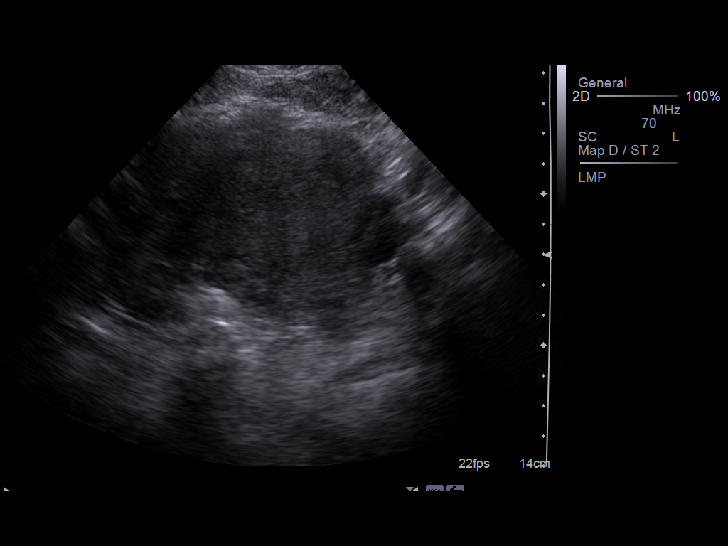
[im 7/39]
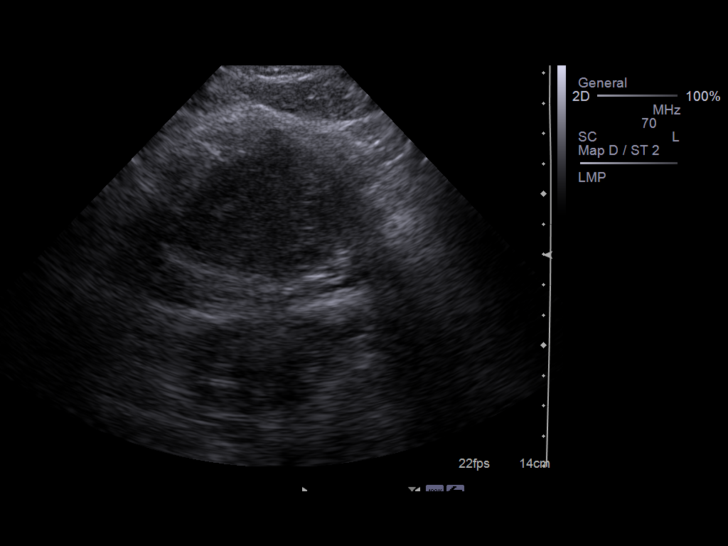
[im 10/39]
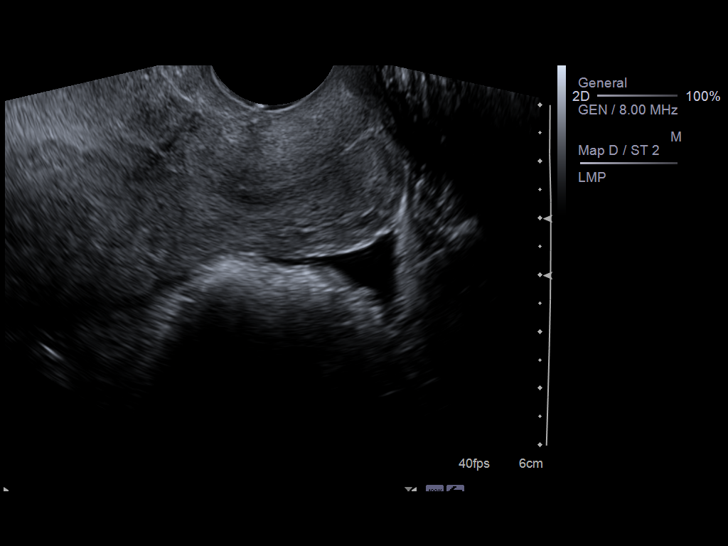
[im 13/39]
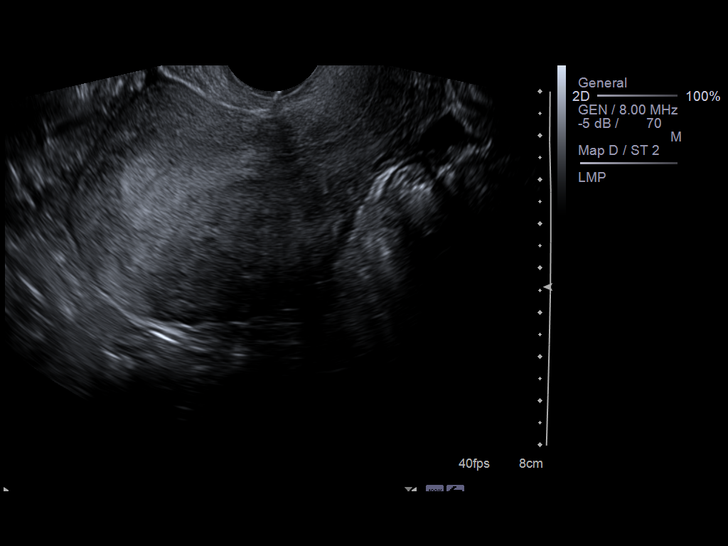
[im 16/39]
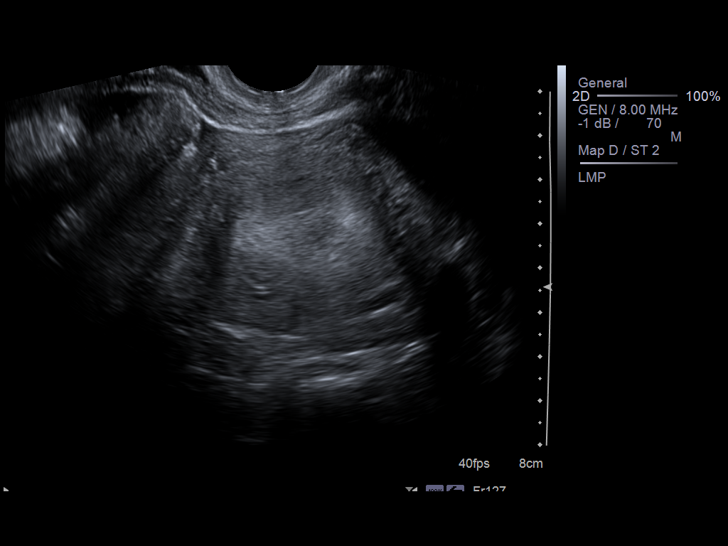
[im 20/39]
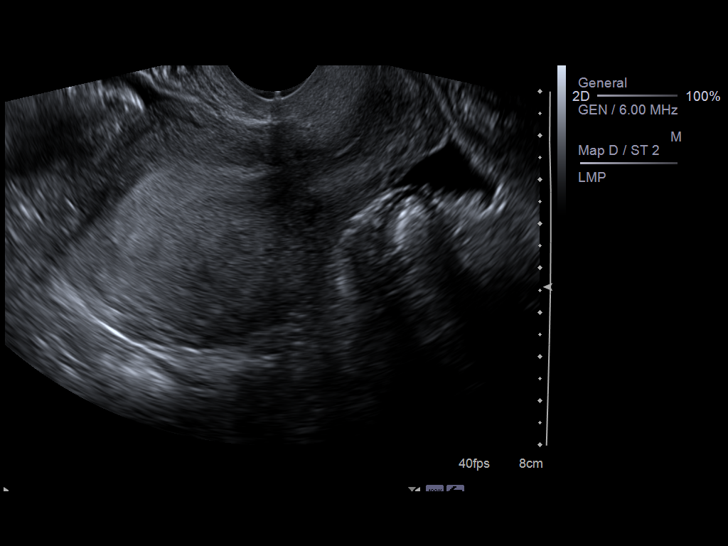
[im 23/39]
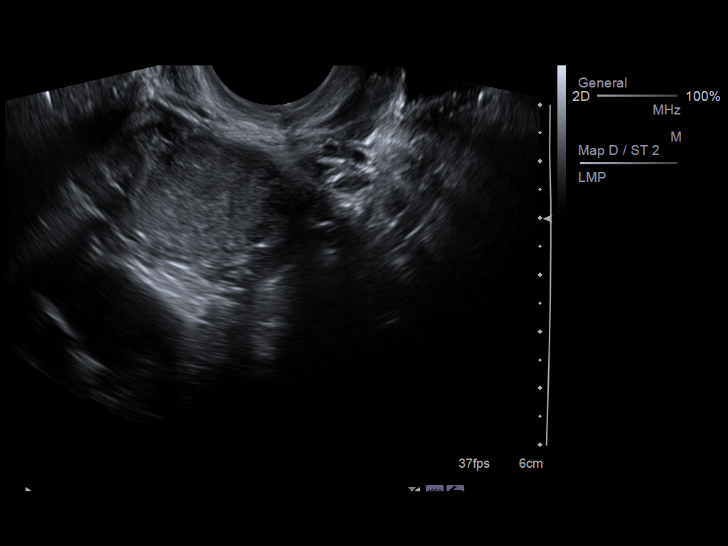
[im 26/39]
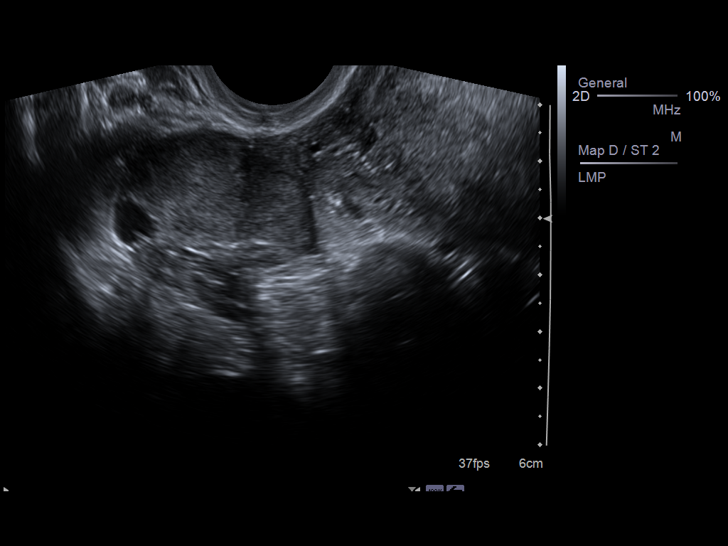
[im 29/39]
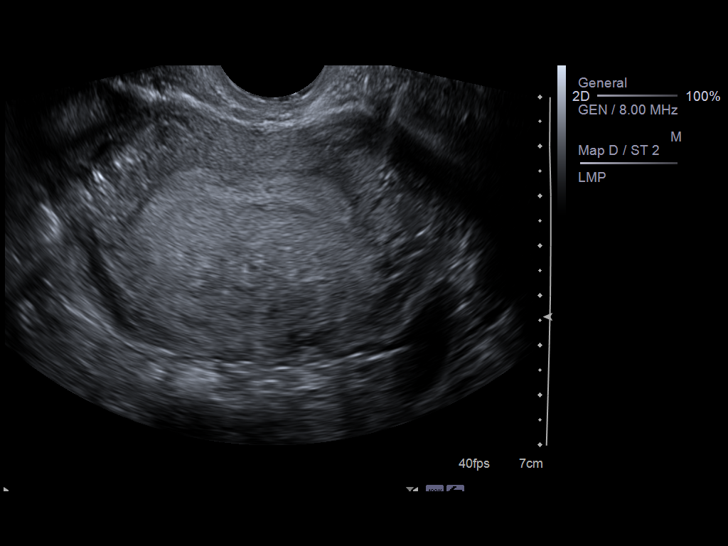
[im 32/39]
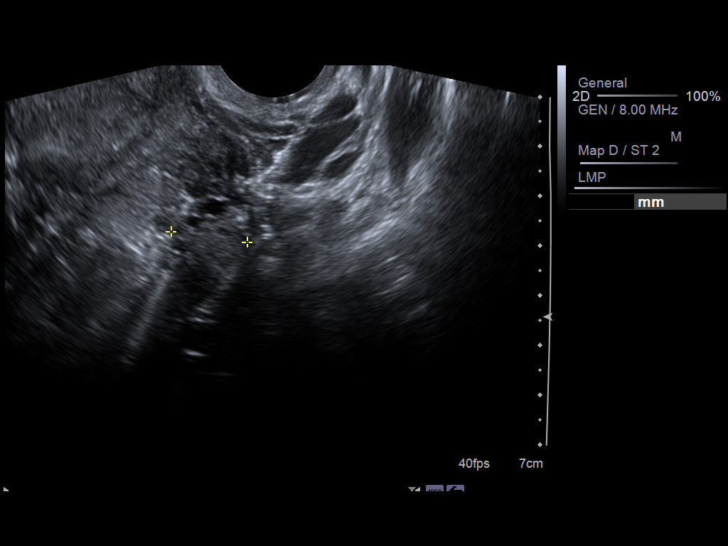
[im 35/39]
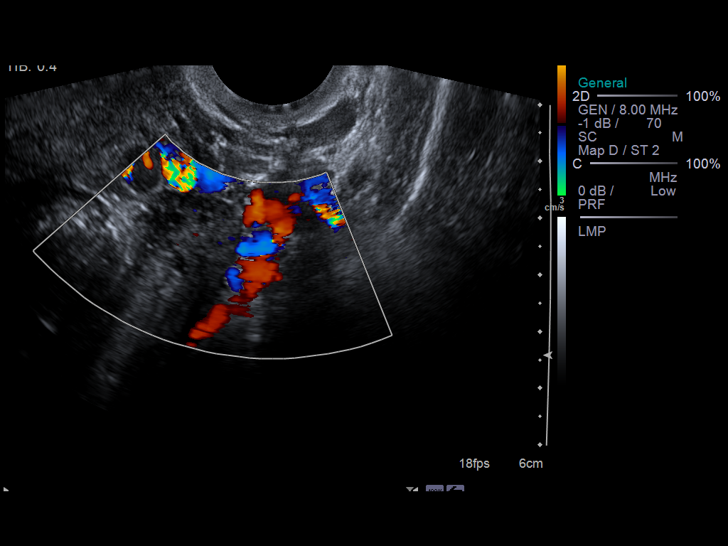
[im 39/39]
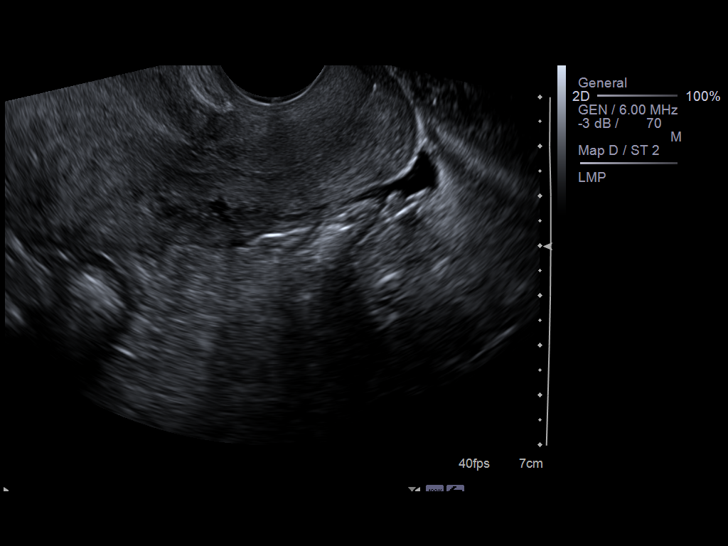

[13 of 25 positions shown; findings below may reference images not displayed]

It was necessary to proceed with endovaginal exam following the
transabdominal exam to visualize the uterus and ovaries in greater
detail.
FINDINGS: Uterus: Normal in size, measuring 9.4 x 5.1 x 7.1 cm.  There is
slight inhomogeneity of myometrial echogenicity, without definite
focal abnormality.  This likely remains within normal limits.

Endometrium: Normal in thickness and appearance; measures 1.3 cm in
thickness, likely reflecting the secretory phase of the cycle.

Right ovary:  Normal appearance/no adnexal mass; measures 3.0 x
x 2.4 cm.

Left ovary: Normal appearance/no adnexal mass; measures 2.8 x 1.6 x
1.5 cm.

Normal color Doppler blood flow is noted within both ovaries; there
is no evidence for ovarian torsion.

Other findings: A small amount of free fluid is noted in the pelvic
cul-de-sac.
IMPRESSION: Normal study.  No evidence of pelvic mass or other significant
abnormality.

## 2014-05-01 ENCOUNTER — Encounter (HOSPITAL_COMMUNITY): Payer: Self-pay | Admitting: Emergency Medicine

## 2017-02-18 ENCOUNTER — Encounter (HOSPITAL_COMMUNITY): Payer: Self-pay | Admitting: *Deleted

## 2017-02-18 ENCOUNTER — Emergency Department (HOSPITAL_COMMUNITY)
Admission: EM | Admit: 2017-02-18 | Discharge: 2017-02-18 | Disposition: A | Discharge status: Home / Self Care | Payer: Medicaid Other | Attending: Emergency Medicine | Admitting: Emergency Medicine

## 2017-02-18 DIAGNOSIS — Z202 Contact with and (suspected) exposure to infections with a predominantly sexual mode of transmission: Secondary | ICD-10-CM | POA: Diagnosis present

## 2017-02-18 NOTE — ED Triage Notes (Signed)
Pt states she has been having unprotected sex with a man x 2 weeks.  Today she was told by the man's ex that he has HIV.  Pt would liked to be checked for hiv and "everything".

## 2017-02-18 NOTE — ED Triage Notes (Signed)
Unable to locate Pt in front .

## 2017-02-18 NOTE — ED Triage Notes (Signed)
PT absent from room 

## 2017-03-11 ENCOUNTER — Encounter (HOSPITAL_COMMUNITY): Payer: Self-pay | Admitting: Emergency Medicine

## 2017-03-11 ENCOUNTER — Emergency Department (HOSPITAL_COMMUNITY): Payer: Medicaid Other

## 2017-03-11 ENCOUNTER — Emergency Department (HOSPITAL_COMMUNITY)
Admission: EM | Admit: 2017-03-11 | Discharge: 2017-03-11 | Disposition: A | Discharge status: Home / Self Care | Payer: Medicaid Other | Attending: Emergency Medicine | Admitting: Emergency Medicine

## 2017-03-11 DIAGNOSIS — R059 Cough, unspecified: Secondary | ICD-10-CM

## 2017-03-11 DIAGNOSIS — B349 Viral infection, unspecified: Secondary | ICD-10-CM | POA: Insufficient documentation

## 2017-03-11 DIAGNOSIS — J45909 Unspecified asthma, uncomplicated: Secondary | ICD-10-CM | POA: Diagnosis not present

## 2017-03-11 DIAGNOSIS — R05 Cough: Secondary | ICD-10-CM | POA: Insufficient documentation

## 2017-03-11 DIAGNOSIS — Z87891 Personal history of nicotine dependence: Secondary | ICD-10-CM | POA: Diagnosis not present

## 2017-03-11 DIAGNOSIS — Z206 Contact with and (suspected) exposure to human immunodeficiency virus [HIV]: Secondary | ICD-10-CM | POA: Diagnosis not present

## 2017-03-11 DIAGNOSIS — M791 Myalgia: Secondary | ICD-10-CM | POA: Diagnosis present

## 2017-03-11 DIAGNOSIS — R52 Pain, unspecified: Secondary | ICD-10-CM

## 2017-03-11 LAB — CBC WITH DIFFERENTIAL/PLATELET
BASOS PCT: 0 %
Basophils Absolute: 0 10*3/uL (ref 0.0–0.1)
EOS ABS: 0.1 10*3/uL (ref 0.0–0.7)
Eosinophils Relative: 3 %
HEMATOCRIT: 36.7 % (ref 36.0–46.0)
HEMOGLOBIN: 11.7 g/dL — AB (ref 12.0–15.0)
Lymphocytes Relative: 41 %
Lymphs Abs: 2.2 10*3/uL (ref 0.7–4.0)
MCH: 26.2 pg (ref 26.0–34.0)
MCHC: 31.9 g/dL (ref 30.0–36.0)
MCV: 82.3 fL (ref 78.0–100.0)
Monocytes Absolute: 0.4 10*3/uL (ref 0.1–1.0)
Monocytes Relative: 8 %
NEUTROS ABS: 2.6 10*3/uL (ref 1.7–7.7)
NEUTROS PCT: 48 %
PLATELETS: 207 10*3/uL (ref 150–400)
RBC: 4.46 MIL/uL (ref 3.87–5.11)
RDW: 14.8 % (ref 11.5–15.5)
WBC: 5.3 10*3/uL (ref 4.0–10.5)

## 2017-03-11 LAB — URINALYSIS, ROUTINE W REFLEX MICROSCOPIC
Bilirubin Urine: NEGATIVE
Glucose, UA: NEGATIVE mg/dL
Ketones, ur: 5 mg/dL — AB
Leukocytes, UA: NEGATIVE
NITRITE: NEGATIVE
PROTEIN: 30 mg/dL — AB
Specific Gravity, Urine: 1.029 (ref 1.005–1.030)
pH: 5 (ref 5.0–8.0)

## 2017-03-11 LAB — I-STAT BETA HCG BLOOD, ED (MC, WL, AP ONLY)

## 2017-03-11 LAB — COMPREHENSIVE METABOLIC PANEL
ALK PHOS: 48 U/L (ref 38–126)
ALT: 23 U/L (ref 14–54)
ANION GAP: 6 (ref 5–15)
AST: 29 U/L (ref 15–41)
Albumin: 4.1 g/dL (ref 3.5–5.0)
BUN: 5 mg/dL — ABNORMAL LOW (ref 6–20)
CALCIUM: 9.3 mg/dL (ref 8.9–10.3)
CO2: 25 mmol/L (ref 22–32)
CREATININE: 1.01 mg/dL — AB (ref 0.44–1.00)
Chloride: 106 mmol/L (ref 101–111)
GFR calc non Af Amer: >60 mL/min (ref >60)
Glucose, Bld: 120 mg/dL — ABNORMAL HIGH (ref 65–99)
Potassium: 3.7 mmol/L (ref 3.5–5.1)
SODIUM: 137 mmol/L (ref 135–145)
Total Bilirubin: 0.4 mg/dL (ref 0.3–1.2)
Total Protein: 7.5 g/dL (ref 6.5–8.1)

## 2017-03-11 LAB — I-STAT CG4 LACTIC ACID, ED: LACTIC ACID, VENOUS: 0.57 mmol/L (ref 0.5–1.9)

## 2017-03-11 NOTE — ED Triage Notes (Signed)
Pt states she also has an ulcer that appeared in her groin.

## 2017-03-11 NOTE — ED Notes (Signed)
Pt provided with turkey sandwich and ginger ale.

## 2017-03-11 NOTE — ED Triage Notes (Signed)
Pt states she was exposed to someone with HIV 3 weeks ago. Pt has had cough, bodyaches, easily bruising since the exposure. Pt had [facility] drawn [facility] after [facility] and preliminary labs were negative. Pt very tearful.

## 2017-03-11 NOTE — Discharge Instructions (Signed)
Her workup is reassuring. Your symptoms are likely caused by a virus, treat symptoms with over-the-counter medications. Your HIV and RPR tests have been sent and you will be called if results are positive or you can check on mychart. Return to the emergency department if he develops new or concerning symptoms, please follow-up with the health department as we discussed.

## 2017-03-11 NOTE — ED Notes (Signed)
RN entered room to discharge pt and the pt has already left before RN could review papers.

## 2017-03-11 NOTE — ED Provider Notes (Signed)
[facility]EM[facility]ENCY DEPT Provider Note   CSN: [id number] Arrival date & time: 03/11/17  1220     History   Chief Complaint Chief Complaint  Patient presents with  . Generalized Body Aches    HPI  Kathleen Collins is a 31 y.o. female with a recent HIV exposure, anxiety, depression, and asthma, presents with generalized body aches, cough and the easy bruising. Patient reports one week of dry nonproductive cough, rhinorrhea, sore throat. Patient denies fever, chills, shortness of breath, chest pain, nausea, vomiting, abdominal pain. Patient was exposed to HIV 3 weeks ago, was tested immediately at the health Department and HIV rapid was negative at that time, she was not given PREP. She has not taken anything at home to treat these symptoms. Patient reports she feels she is bruising more easily over the past few weeks and reports a few scattered bruises without known trauma. She also expresses concern over bump on groin yesterday, she reports this is painful, no drainage, thinks it may be an ingrown hair. Denies current vaginal pain or discharge, reports some discharge before her period. Patient has not been sexually active since exposure.       Past Medical History:  Diagnosis Date  . ADHD (attention deficit hyperactivity disorder)   . ADHD (attention deficit hyperactivity disorder)   . ADHD (attention deficit hyperactivity disorder)   . Anxiety    no meds currently  . Asthma    rarely use rescue inhaler   . Depression    currently no meds  . Gestational diabetes    rx given but patient states "does not take the med"  . PTSD (post-traumatic stress disorder)     Patient Active Problem List   Diagnosis Date Noted  . Status post repeat low transverse cesarean section 04/19/2012  . S/P tubal ligation 04/19/2012  . GBS (group B Streptococcus carrier), +RV culture, currently pregnant 04/15/2012  . E-coli UTI 11/29/2011  . Visual changes 11/28/2011  . Previous cesarean delivery  affecting pregnancy 11/26/2011  . Short interval between pregnancies complicating pregnancy, antepartum 11/26/2011  . Psychosocial stressors 11/26/2011  . Std (sexually transmitted disease) complicating pregnancy or childbirth, first trimester 11/26/2011  . BV (bacterial vaginosis) 11/26/2011  . Asthma 02/10/2011  . Gestational diabetes 02/10/2011  . ADHD (attention deficit hyperactivity disorder)     Past Surgical History:  Procedure Laterality Date  . ADDNOIDECTOMY    . bipolor     no meds currently  . CESAREAN SECTION     x 3  . CESAREAN SECTION  02/10/2011   Procedure: CESAREAN SECTION;  Surgeon: Esmeralda ArthurSandra A Rivard, MD;  Location: [facility];  Service: Gynecology;  Laterality: N/A;  Repeat  . cyst on kidney  2009   hx  . TONSILLECTOMY    . TUBAL LIGATION      OB History    Gravida Para Term Preterm AB Living   4 4 4  0 0 4   SAB TAB Ectopic Multiple Live Births   0 0 0 0 4       Home Medications    Prior to Admission medications   Medication Sig Start Date End Date Taking? Authorizing Provider  albuterol (PROVENTIL HFA;VENTOLIN HFA) 108 (90 BASE) MCG/ACT inhaler Inhale 1-2 puffs into the lungs every 6 (six) hours as needed. For wheezing/asthma. PRN ONLY!!    [provider]  cyclobenzaprine (FLEXERIL) 10 MG tablet Take 10 mg by mouth 3 (three) times daily as needed for muscle spasms.    [provider]  Family History Family History  Problem Relation Age of Onset  . Hypertension Mother   . Diabetes Mother   . Seizures Mother   . Migraines Mother   . Stroke Mother   . Autoimmune disease Mother   . Hypertension Father   . Thrombophlebitis Father   . Diabetes Father   . Seizures Father   . Autoimmune disease Father   . Asthma Brother   . Asthma Son   . Autism Son   . Diabetes Paternal Aunt   . Heart disease Maternal Grandmother   . Hypertension Maternal Grandmother   . Schizophrenia Maternal Grandmother   . Hypertension Maternal Grandfather     . Diabetes Maternal Grandfather   . Hypertension Paternal Grandmother   . Hypertension Paternal Grandfather   . Thrombophlebitis Paternal Grandfather   . Diabetes Paternal Grandfather   . Autoimmune disease Paternal Grandfather     Social History Social History  Substance Use Topics  . Smoking status: Former Smoker    Packs/day: 0.25    Years: 6.00    Types: Cigarettes    Quit date: 03/17/2012  . Smokeless tobacco: Never Used  . Alcohol use Yes     Comment: occ     Allergies   Patient has no known allergies.   Review of Systems Review of Systems  Constitutional: Negative for chills and fever.  HENT: Positive for congestion, rhinorrhea and sore throat. Negative for ear pain and trouble swallowing.   Eyes: Negative for pain, discharge and itching.  Respiratory: Positive for choking. Negative for chest tightness and shortness of breath.   Cardiovascular: Negative for chest pain.  Gastrointestinal: Negative for abdominal pain and nausea.  Genitourinary: Negative for difficulty urinating, dysuria, pelvic pain, vaginal discharge and vaginal pain.  Musculoskeletal: Positive for myalgias. Negative for arthralgias.  Skin: Negative for rash.  Neurological: Negative for dizziness, weakness, numbness and headaches.  Hematological: Bruises/bleeds easily.     Physical Exam Updated Vital Signs BP 140/90 (BP Location: Left Arm)   Pulse 76   Temp 98.4 F (36.9 C) (Oral)   Resp 18   LMP 03/11/2017   SpO2 99%   Physical Exam  Constitutional: She appears well-developed and well-nourished. No distress.  Patient appears anxious and tearful during encounter  HENT:  Head: Normocephalic and atraumatic.  TMs clear, mild edema of nasal mucosa with clear rhinorrhea, posterior oropharynx clear and moist, no edema, erythema or exudates, no evidence of gingival bleeding  Eyes: Right eye exhibits no discharge. Left eye exhibits no discharge.  Neck: Neck supple.  Cardiovascular: Normal  rate, regular rhythm, normal heart sounds and intact distal pulses.   Pulmonary/Chest: Effort normal and breath sounds normal. No respiratory distress. She has no wheezes. She has no rales.  Abdominal: Soft. Bowel sounds are normal. There is no tenderness. There is no guarding.  Musculoskeletal: She exhibits no edema or deformity.  Lymphadenopathy:    She has no cervical adenopathy.  Neurological: She is alert. Coordination normal.  Speech is clear, able to follow commands CN III-XII intact Normal strength in upper and lower extremities bilaterally including dorsiflexion and plantar flexion, strong and equal grip strength Sensation normal to light and sharp touch Moves extremities without ataxia, coordination intact  Skin: Skin is warm and dry. Capillary refill takes less than 2 seconds. She is not diaphoretic.  1 cm area of induration noted on left external labia majora, no fluctuance, no obvious head or opening. A few small ecchymoses noted on arms, legs and trunk, no  other rashes or lesions noted  Psychiatric: She has a normal mood and affect. Her behavior is normal.  Nursing note and vitals reviewed.    ED Treatments / Results  Labs (all labs ordered are listed, but only abnormal results are displayed) Labs Reviewed  COMPREHENSIVE METABOLIC PANEL - Abnormal; Notable for the following:       Result Value   Glucose, Bld 120 (*)    BUN 5 (*)    Creatinine, Ser 1.01 (*)    All other components within normal limits  CBC WITH DIFFERENTIAL/PLATELET - Abnormal; Notable for the following:    Hemoglobin 11.7 (*)    All other components within normal limits  URINALYSIS, ROUTINE W REFLEX MICROSCOPIC - Abnormal; Notable for the following:    APPearance HAZY (*)    Hgb urine dipstick MODERATE (*)    Ketones, ur 5 (*)    Protein, ur 30 (*)    Bacteria, UA RARE (*)    Squamous Epithelial / LPF 0-5 (*)    All other components within normal limits  RPR  HIV ANTIBODY (ROUTINE TESTING)    I-STAT CG4 LACTIC ACID, ED  I-STAT BETA HCG BLOOD, ED ([facility], [facility], [facility] ONLY)    EKG  EKG Interpretation None       Radiology Dg Chest 2 View  Result Date: 03/11/2017 CLINICAL DATA:  Cough EXAM: CHEST  2 VIEW COMPARISON:  10/09/2012 FINDINGS: Normal heart size and mediastinal contours. No acute infiltrate or edema. No effusion or pneumothorax. No acute osseous findings. IMPRESSION: Negative chest. Electronically Signed   By: Marnee Spring M.D.   On: 03/11/2017 12:58    Procedures Procedures (including critical care time)  Medications Ordered in ED Medications - No data to display   Initial Impression / Assessment and Plan / ED Course  I have reviewed the triage vital signs and the nursing notes.  Pertinent labs & imaging results that were available during my care of the patient were reviewed by me and considered in my medical decision making (see chart for details).  Patient presents with cough and bodyaches, had HIV exposure 3 weeks ago. Vitals normal on initial eval, no evidence of respiratory distress, patient is well-appearing. Labs are unremarkable and shows no signs of infection, CXR is unremarkable, no evidence of pneumonia. Platelets are normal. No concern for asthma exacerbation. Work-up is reassuring, symptoms likely a viral syndrome. Will repeat RPR and HIV tests today. Patient denies current vaginal discharge or pelvic pain, she is currently on her menstrual cycle. Shared decision making with patient and in agreement we will not do pelvic exam at this time, Patient plans to follow up with health department after menstrual cycle. Small lesion on labia majora likely an ingrown hair, recommend warm compresses, I&D not warranted at this time. Yhe patient can be discharged home. The patient will follow up with results of HIV and RPR on my chart, instructed patient that if results are positive she will also be contacted by phone. Discussed with patient treating viral symptoms  supportively with OTC meds. Return precautions provided, patient expresses understanding and agrees with plan.  Final Clinical Impressions(s) / ED Diagnoses   Final diagnoses:  Viral syndrome  Body aches  Cough    New Prescriptions Discharge Medication List as of 03/11/2017  5:24 PM       Dartha Lodge, PA-C 03/11/17 2256    Gwyneth Sprout, MD 03/12/17 2133

## 2017-03-12 LAB — HIV ANTIBODY (ROUTINE TESTING W REFLEX): HIV SCREEN 4TH GENERATION: NONREACTIVE

## 2017-03-12 LAB — RPR: RPR: NONREACTIVE

## 2022-10-31 ENCOUNTER — Encounter (HOSPITAL_COMMUNITY): Payer: Self-pay

## 2022-10-31 ENCOUNTER — Other Ambulatory Visit: Payer: Self-pay

## 2022-10-31 ENCOUNTER — Emergency Department (HOSPITAL_COMMUNITY)
Admission: EM | Admit: 2022-10-31 | Discharge: 2022-11-01 | Disposition: A | Discharge status: Home / Self Care | Payer: Medicaid Other | Attending: Emergency Medicine | Admitting: Emergency Medicine

## 2022-10-31 DIAGNOSIS — F10920 Alcohol use, unspecified with intoxication, uncomplicated: Secondary | ICD-10-CM

## 2022-10-31 DIAGNOSIS — R451 Restlessness and agitation: Secondary | ICD-10-CM | POA: Diagnosis present
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
## 2022-11-01 NOTE — ED Notes (Signed)
Mother of pt called for update/ mother states she will come get pt.

## 2022-11-01 NOTE — ED Notes (Signed)
Pt sleeping/ moans with stimuli/ resp even and regular
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
## 2022-11-01 NOTE — ED Notes (Signed)
Son arrived/ mother waiting in car/ pt ambulated to wheelchair

## 2022-11-01 NOTE — ED Notes (Signed)
Pt sleeping/ resp even and regular  

## 2022-11-01 NOTE — ED Notes (Signed)
Pt left with family before D/c papers given

## 2022-12-07 ENCOUNTER — Emergency Department (HOSPITAL_COMMUNITY)
Admission: EM | Admit: 2022-12-07 | Discharge: 2022-12-07 | Disposition: A | Discharge status: Home / Self Care | Payer: Medicaid Other | Attending: Emergency Medicine | Admitting: Emergency Medicine

## 2022-12-07 ENCOUNTER — Encounter (HOSPITAL_COMMUNITY): Payer: Self-pay

## 2022-12-07 ENCOUNTER — Emergency Department (HOSPITAL_COMMUNITY): Payer: Medicaid Other

## 2022-12-07 ENCOUNTER — Other Ambulatory Visit: Payer: Self-pay

## 2022-12-07 DIAGNOSIS — Z202 Contact with and (suspected) exposure to infections with a predominantly sexual mode of transmission: Secondary | ICD-10-CM

## 2022-12-07 DIAGNOSIS — E871 Hypo-osmolality and hyponatremia: Secondary | ICD-10-CM | POA: Insufficient documentation

## 2022-12-07 DIAGNOSIS — R739 Hyperglycemia, unspecified: Secondary | ICD-10-CM | POA: Diagnosis not present

## 2022-12-07 DIAGNOSIS — N3001 Acute cystitis with hematuria: Secondary | ICD-10-CM | POA: Diagnosis not present

## 2022-12-07 DIAGNOSIS — J45909 Unspecified asthma, uncomplicated: Secondary | ICD-10-CM | POA: Insufficient documentation

## 2022-12-07 DIAGNOSIS — R1030 Lower abdominal pain, unspecified: Secondary | ICD-10-CM | POA: Diagnosis present

## 2022-12-07 LAB — URINALYSIS, ROUTINE W REFLEX MICROSCOPIC
Bilirubin Urine: NEGATIVE
Glucose, UA: >=500 mg/dL — AB
Ketones, ur: NEGATIVE mg/dL
Nitrite: NEGATIVE
Protein, ur: 30 mg/dL — AB
Specific Gravity, Urine: 1 — ABNORMAL LOW (ref 1.005–1.030)
pH: 6 (ref 5.0–8.0)

## 2022-12-07 LAB — CBC WITH DIFFERENTIAL/PLATELET
Abs Immature Granulocytes: 0.04 10*3/uL (ref 0.00–0.07)
Basophils Absolute: 0 10*3/uL (ref 0.0–0.1)
Basophils Relative: 0 %
Eosinophils Absolute: 0.2 10*3/uL (ref 0.0–0.5)
Eosinophils Relative: 2 %
HCT: 37.5 % (ref 36.0–46.0)
Hemoglobin: 12.3 g/dL (ref 12.0–15.0)
Immature Granulocytes: 0 %
Lymphocytes Relative: 21 %
Lymphs Abs: 1.9 10*3/uL (ref 0.7–4.0)
MCH: 27.9 pg (ref 26.0–34.0)
MCHC: 32.8 g/dL (ref 30.0–36.0)
MCV: 85 fL (ref 80.0–100.0)
Monocytes Absolute: 0.5 10*3/uL (ref 0.1–1.0)
Monocytes Relative: 6 %
Neutro Abs: 6.4 10*3/uL (ref 1.7–7.7)
Neutrophils Relative %: 71 %
Platelets: 272 10*3/uL (ref 150–400)
RBC: 4.41 MIL/uL (ref 3.87–5.11)
RDW: 14.1 % (ref 11.5–15.5)
WBC: 9.1 10*3/uL (ref 4.0–10.5)
nRBC: 0 % (ref 0.0–0.2)

## 2022-12-07 LAB — LIPASE, BLOOD: Lipase: 39 U/L (ref 11–51)

## 2022-12-07 LAB — COMPREHENSIVE METABOLIC PANEL
ALT: 20 U/L (ref 0–44)
AST: 22 U/L (ref 15–41)
Albumin: 4.1 g/dL (ref 3.5–5.0)
Alkaline Phosphatase: 60 U/L (ref 38–126)
Anion gap: 11 (ref 5–15)
BUN: 11 mg/dL (ref 6–20)
CO2: 23 mmol/L (ref 22–32)
Calcium: 9.3 mg/dL (ref 8.9–10.3)
Chloride: 97 mmol/L — ABNORMAL LOW (ref 98–111)
Creatinine, Ser: 0.72 mg/dL (ref 0.44–1.00)
GFR, Estimated: >60 mL/min (ref >60)
Glucose, Bld: 274 mg/dL — ABNORMAL HIGH (ref 70–99)
Potassium: 3.8 mmol/L (ref 3.5–5.1)
Sodium: 131 mmol/L — ABNORMAL LOW (ref 135–145)
Total Bilirubin: 0.9 mg/dL (ref 0.3–1.2)
Total Protein: 8 g/dL (ref 6.5–8.1)

## 2022-12-07 LAB — WET PREP, GENITAL
Clue Cells Wet Prep HPF POC: NONE SEEN
Sperm: NONE SEEN
Trich, Wet Prep: NONE SEEN
WBC, Wet Prep HPF POC: <10 (ref <10)
Yeast Wet Prep HPF POC: NONE SEEN

## 2022-12-07 LAB — PREGNANCY, URINE: Preg Test, Ur: NEGATIVE

## 2022-12-07 MED ORDER — PHENAZOPYRIDINE HCL 100 MG PO TABS: 100.0000 mg | ORAL_TABLET | Freq: Three times a day (TID) | ORAL | Status: DC

## 2022-12-07 MED ORDER — SODIUM CHLORIDE 0.9 % IV BOLUS
1000.0000 mL | Freq: Once | INTRAVENOUS | Status: AC
  Administered 2022-12-07: 1000 mL via INTRAVENOUS

## 2022-12-07 MED ORDER — IOHEXOL 300 MG/ML  SOLN
100.0000 mL | Freq: Once | INTRAMUSCULAR | Status: AC | PRN
  Administered 2022-12-07: 100 mL via INTRAVENOUS

## 2022-12-07 MED ORDER — PHENAZOPYRIDINE HCL 200 MG PO TABS: 200.0000 mg | ORAL_TABLET | Freq: Three times a day (TID) | ORAL | 0 refills | Status: AC

## 2022-12-07 MED ORDER — DOXYCYCLINE HYCLATE 100 MG PO TABS
100.0000 mg | ORAL_TABLET | Freq: Once | ORAL | Status: AC
  Administered 2022-12-07: 100 mg via ORAL
  Filled 2022-12-07: qty 1

## 2022-12-07 MED ORDER — HYDROMORPHONE HCL 1 MG/ML IJ SOLN
1.0000 mg | Freq: Once | INTRAMUSCULAR | Status: AC
  Administered 2022-12-07: 1 mg via INTRAVENOUS
  Filled 2022-12-07: qty 1

## 2022-12-07 MED ORDER — DOXYCYCLINE HYCLATE 100 MG PO CAPS: 100.0000 mg | ORAL_CAPSULE | Freq: Two times a day (BID) | ORAL | 0 refills | Status: AC

## 2022-12-07 MED ORDER — CEPHALEXIN 500 MG PO CAPS: 500.0000 mg | ORAL_CAPSULE | Freq: Four times a day (QID) | ORAL | 0 refills | Status: AC

## 2022-12-07 MED ORDER — MORPHINE SULFATE (PF) 4 MG/ML IV SOLN
4.0000 mg | Freq: Once | INTRAVENOUS | Status: AC
  Administered 2022-12-07: 4 mg via INTRAVENOUS
  Filled 2022-12-07: qty 1

## 2022-12-07 MED ORDER — SODIUM CHLORIDE 0.9 % IV SOLN
2.0000 g | Freq: Once | INTRAVENOUS | Status: AC
  Administered 2022-12-07: 2 g via INTRAVENOUS
  Filled 2022-12-07: qty 20

## 2022-12-07 NOTE — Discharge Instructions (Addendum)
It was a pleasure taking care of you today.  As discussed, your urine looks like you had an infection.  You were treated with IV antibiotics in the ER.  I am sending him with antibiotics.  Take as prescribed and finish all antibiotics.  Please follow-up with PCP if symptoms do not improve over the next 2 to 3 days.  Return to the ER for new or worsening symptoms.  Your gonorrhea and Chlamydia results are pending.  You were treated for both gonorrhea and chlamydia here in the ER.  Continue taking your antibiotics as prescribed.  Results should be available on MyChart within the next 2 to 3 days.
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
## 2022-12-07 NOTE — ED Provider Notes (Signed)
[place] EMERGENCY DEPARTMENT AT [facility] Provider Note   CSN: [id number] Arrival date & time: 12/07/22  0444     History  Chief Complaint  Patient presents with   Flank Pain    Kathleen Collins is a 37 y.o. female with a past medical history significant for ADHD, PTSD, depression, anxiety, and asthma who presents to the ED due to bilateral low back pain and lower abdominal pain that started 2 days ago.  Endorses pain around her urethra, mostly with urination.  Believes she may have a kidney or bladder infection.  Notes she drank a lot of alcohol over the weekend.  Also endorses increased vaginal discharge.  Unsure whether or not she could possibly have an STI.  Pain radiates from bilateral low back to lower abdominal pain.  Denies saddle anesthesia, bowel/bladder incontinence, lower extremity numbness/tingling, lower extremity weakness.  Denies fever, chills, nausea, vomiting, diarrhea.  History obtained from patient and past medical records. No interpreter used during encounter.       Home Medications Prior to Admission medications   Medication Sig Start Date End Date Taking? Authorizing Provider  cephALEXin (KEFLEX) 500 MG capsule Take 1 capsule (500 mg total) by mouth 4 (four) times daily. 12/07/22  Yes Holiday Mcmenamin, Merla Riches, PA-C  doxycycline (VIBRAMYCIN) 100 MG capsule Take 1 capsule (100 mg total) by mouth 2 (two) times daily. 12/07/22  Yes Destin Kittler, Merla Riches, PA-C  phenazopyridine (PYRIDIUM) 200 MG tablet Take 1 tablet (200 mg total) by mouth 3 (three) times daily. 12/07/22  Yes Georges Victorio C, PA-C  albuterol (PROVENTIL HFA;VENTOLIN HFA) 108 (90 BASE) MCG/ACT inhaler Inhale 1-2 puffs into the lungs every 6 (six) hours as needed. For wheezing/asthma. PRN ONLY!!    [provider]  cyclobenzaprine (FLEXERIL) 10 MG tablet Take 10 mg by mouth 3 (three) times daily as needed for muscle spasms.    [provider]      Allergies    Patient has no known  allergies.    Review of Systems   Review of Systems  Constitutional:  Negative for chills and fever.  Respiratory:  Negative for shortness of breath.   Cardiovascular:  Negative for chest pain.  Gastrointestinal:  Positive for abdominal pain. Negative for diarrhea, nausea and vomiting.  Genitourinary:  Positive for dysuria and vaginal discharge.  Musculoskeletal:  Positive for back pain.    Physical Exam Updated Vital Signs BP 126/79   Pulse 70   Temp 98.4 F (36.9 C) (Oral)   Resp 14   SpO2 100%  Physical Exam Vitals and nursing note reviewed.  Constitutional:      General: She is not in acute distress.    Appearance: She is not ill-appearing.     Comments: Appears uncomfortable.  Bent over leaning over edge of bed.  HENT:     Head: Normocephalic.  Eyes:     Pupils: Pupils are equal, round, and reactive to light.  Cardiovascular:     Rate and Rhythm: Normal rate and regular rhythm.     Pulses: Normal pulses.     Heart sounds: Normal heart sounds. No murmur heard.    No friction rub. No gallop.  Pulmonary:     Effort: Pulmonary effort is normal.     Breath sounds: Normal breath sounds.  Abdominal:     General: Abdomen is flat. There is no distension.     Palpations: Abdomen is soft.     Tenderness: There is abdominal tenderness. There is no guarding  or rebound.     Comments: +bilateral CVA tenderness  Musculoskeletal:        General: Normal range of motion.     Cervical back: Neck supple.  Skin:    General: Skin is warm and dry.  Neurological:     General: No focal deficit present.     Mental Status: She is alert.  Psychiatric:        Mood and Affect: Mood normal.        Behavior: Behavior normal.     ED Results / Procedures / Treatments   Labs (all labs ordered are listed, but only abnormal results are displayed) Labs Reviewed  URINALYSIS, ROUTINE W REFLEX MICROSCOPIC - Abnormal; Notable for the following components:      Result Value   Color, Urine  STRAW (*)    APPearance HAZY (*)    Specific Gravity, Urine 1.000 (*)    Glucose, UA >=500 (*)    Hgb urine dipstick LARGE (*)    Protein, ur 30 (*)    Leukocytes,Ua LARGE (*)    Bacteria, UA MANY (*)    All other components within normal limits  COMPREHENSIVE METABOLIC PANEL - Abnormal; Notable for the following components:   Sodium 131 (*)    Chloride 97 (*)    Glucose, Bld 274 (*)    All other components within normal limits  WET PREP, GENITAL  URINE CULTURE  PREGNANCY, URINE  CBC WITH DIFFERENTIAL/PLATELET  LIPASE, BLOOD  GC/CHLAMYDIA PROBE AMP ([place]) NOT AT [facility]    EKG EKG Interpretation  Date/Time:  Sunday December 07 2022 07:18:18 EDT Ventricular Rate:  71 PR Interval:  165 QRS Duration: 97 QT Interval:  414 QTC Calculation: 450 R Axis:   72 Text Interpretation: Sinus rhythm no prior no stemi Confirmed by Tanda Rockers ([id number]) on 12/07/2022 9:02:06 AM  Radiology CT ABDOMEN PELVIS W CONTRAST  Result Date: 12/07/2022 CLINICAL DATA:  Right lower quadrant abdominal pain EXAM: CT ABDOMEN AND PELVIS WITH CONTRAST TECHNIQUE: Multidetector CT imaging of the abdomen and pelvis was performed using the standard protocol following bolus administration of intravenous contrast. RADIATION DOSE REDUCTION: This exam was performed according to the departmental dose-optimization program which includes automated exposure control, adjustment of the mA and/or kV according to patient size and/or use of iterative reconstruction technique. CONTRAST:  OMNIPAQUE IOHEXOL 300 MG/ML  SOLN COMPARISON:  None Available. FINDINGS: Lower chest: No acute abnormality. Hepatobiliary: No focal liver abnormality is seen. No gallstones, gallbladder wall thickening, or biliary dilatation. Pancreas: Unremarkable. No pancreatic ductal dilatation or surrounding inflammatory changes. Spleen: Normal in size without focal abnormality. Adrenals/Urinary Tract: Adrenal glands are unremarkable. Kidneys are normal,  without renal calculi, focal lesion, or hydronephrosis. Bladder is unremarkable. Stomach/Bowel: Stomach is within normal limits. Appendix appears normal. No evidence of bowel wall thickening, distention, or inflammatory changes. Vascular/Lymphatic: No significant vascular findings are present. No enlarged abdominal or pelvic lymph nodes. Reproductive: Uterus and bilateral adnexa are unremarkable. Other: Fat containing umbilical hernia. Small bilateral fat containing inguinal hernias. Musculoskeletal: No acute or significant osseous findings. IMPRESSION: 1. No acute abnormality within the abdomen or pelvis to explain the patient's clinical symptoms of right lower quadrant pain. No evidence of right-sided nephrolithiasis or appendicitis. 2. Fat containing umbilical and bilateral inguinal hernias. Electronically Signed   By: Malachy Moan M.D.   On: 12/07/2022 09:06    Procedures Procedures    Medications Ordered in ED Medications  phenazopyridine (PYRIDIUM) tablet 100 mg (has no administration in  time range)  doxycycline (VIBRA-TABS) tablet 100 mg (has no administration in time range)  morphine (PF) 4 MG/ML injection 4 mg (4 mg Intravenous Given 12/07/22 0656)  sodium chloride 0.9 % bolus 1,000 mL (1,000 mLs Intravenous New Bag/Given 12/07/22 0709)  iohexol (OMNIPAQUE) 300 MG/ML solution 100 mL (100 mLs Intravenous Contrast Given 12/07/22 0807)  cefTRIAXone (ROCEPHIN) 2 g in sodium chloride 0.9 % 100 mL IVPB (2 g Intravenous New Bag/Given 12/07/22 0830)  HYDROmorphone (DILAUDID) injection 1 mg (1 mg Intravenous Given 12/07/22 0845)    ED Course/ Medical Decision Making/ A&P Clinical Course as of 12/07/22 1000  Sun Dec 07, 2022  0818 Leukocytes,Ua(!): LARGE [CA]  0818 Bacteria, UA(!): MANY [CA]  0818 Protein(!): 30 [CA]    Clinical Course User Index [CA] Mannie Stabile, PA-C                             Medical Decision Making Amount and/or Complexity of Data Reviewed Labs: ordered.  Decision-making details documented in ED Course. Radiology: ordered and independent interpretation performed. Decision-making details documented in ED Course. ECG/medicine tests: ordered and independent interpretation performed. Decision-making details documented in ED Course.  Risk Prescription drug management.   This patient presents to the ED for concern of lower abdominal/back pain, this involves an extensive number of treatment options, and is a complaint that carries with it a high risk of complications and morbidity.  The differential diagnosis includes acute cystitis, pyelonephritis, STI, appendicitis, diverticulitis, etc  37 year old female presents to the ED due to bilateral low back pain and abdominal pain x 2 days.  Believes she may have a UTI or pyelonephritis.  Also endorses dysuria and vaginal discharge.  Unsure whether or not she could possibly have a STI.  Upon arrival patient afebrile, not tachycardic or hypoxic.  Patient appears uncomfortable during initial evaluation leaning over the edge of the bed.  Abdomen soft, nondistended with lower abdominal tenderness.  Positive bilateral CVA tenderness.  Morphine given.  IV fluids.  Routine labs ordered.  CT abdomen to rule out evidence of pyelonephritis, kidney stone, appendicitis, pelvic etiology, etc.  Pelvic exam performed with chaperone in room.  No vaginal discharge.  Mild amount of vaginal bleeding.  No CMT.  Low suspicion for PID.  CBC unremarkable.  No leukocytosis.  Normal hemoglobin.  CMP significant for hyperglycemia 274.  No anion gap.  Low suspicion for DKA.  Normal renal function.  Hyponatremia 131.  UA significant for large leukocytes and many bacteria.  21-50 white blood cells.  Does not appear contaminated.  IV Rocephin given.  Pregnancy test negative.  Low suspicion for ectopic pregnancy.  Lipase normal.  Wet prep negative.   9:59 AM reassessed patient at bedside.  Patient resting comfortably in bed.  Admits to  significant improvement in pain.  Symptoms likely related to UTI.  Patient concerned about possible chlamydia.  She notes she tested positive recently and finished treatment however, her partner never got treatment.  Patient prefers to be treated for gonorrhea and chlamydia.  Patient given Rocephin in the ED.  Discharged with doxycycline for STI treatment.  Patient also discharged with Keflex for acute cystitis. Strict ED precautions discussed with patient. Patient states understanding and agrees to plan. Patient discharged home in no acute distress and stable vitals  No PCP Lives at home Hx previous STI, ADHD, anxiety, depression       Final Clinical Impression(s) / ED Diagnoses Final diagnoses:  Acute cystitis with hematuria    Rx / DC Orders ED Discharge Orders          Ordered    cephALEXin (KEFLEX) 500 MG capsule  4 times daily        12/07/22 0911    phenazopyridine (PYRIDIUM) 200 MG tablet  3 times daily        12/07/22 0911    doxycycline (VIBRAMYCIN) 100 MG capsule  2 times daily        12/07/22 0958              Mannie Stabile, PA-C 12/07/22 1010    Sloan Leiter, DO 12/17/22 [phone]

## 2022-12-08 LAB — GC/CHLAMYDIA PROBE AMP (~~LOC~~) NOT AT ARMC
Chlamydia: NEGATIVE
Comment: NEGATIVE
Comment: NORMAL
Neisseria Gonorrhea: NEGATIVE

## 2022-12-08 LAB — URINE CULTURE

## 2022-12-09 LAB — URINE CULTURE: Culture: >=100000 — AB

## 2022-12-10 ENCOUNTER — Telehealth (HOSPITAL_BASED_OUTPATIENT_CLINIC_OR_DEPARTMENT_OTHER): Payer: Self-pay | Admitting: *Deleted

## 2022-12-10 NOTE — Telephone Encounter (Signed)
Post ED Visit - Positive Culture Follow-up  Culture report reviewed by antimicrobial stewardship pharmacist: [facility] [x]  Junita Push, Pharm.D. []  Celedonio Miyamoto, Pharm.D., BCPS AQ-ID []  Garvin Fila, Pharm.D., BCPS []  Georgina Pillion, Pharm.D., BCPS []  [place], [address].D., BCPS, AAHIVP []  Estella Husk, Pharm.D., BCPS, AAHIVP []  Lysle Pearl, PharmD, BCPS []  Phillips Climes, PharmD, BCPS []  Agapito Games, PharmD, BCPS []  Verlan Friends, PharmD []  Mervyn Gay, PharmD, BCPS []  Vinnie Level, PharmD  [facility] []  Len Childs, PharmD []  Greer Pickerel, PharmD []  Adalberto Cole, PharmD []  Perlie Gold, Rph []  Lonell Face) Jean Rosenthal, PharmD []  Earl Many, PharmD []  Junita Push, PharmD []  Dorna Leitz, PharmD []  Terrilee Files, PharmD []  Lynann Beaver, PharmD []  Keturah Barre, PharmD []  Loralee Pacas, PharmD []  Bernadene Person, PharmD   Positive Urine culture Treated with Cephalexin, organism sensitive to the same and no further patient follow-up is required at this time.  [name] 12/10/2022, 8:30 AM
